# Patient Record
Sex: Female | Born: 2000 | Hispanic: Yes | Marital: Single | Smoking: Never smoker
Health system: Southern US, Community
[De-identification: ages and names within clinical notes are randomized; demographics above are authoritative.]

## PROBLEM LIST (undated history)

## (undated) DIAGNOSIS — N390 Urinary tract infection, site not specified: Secondary | ICD-10-CM

## (undated) DIAGNOSIS — F419 Anxiety disorder, unspecified: Secondary | ICD-10-CM

## (undated) DIAGNOSIS — K219 Gastro-esophageal reflux disease without esophagitis: Secondary | ICD-10-CM

## (undated) HISTORY — DX: Anxiety disorder, unspecified: F41.9

## (undated) HISTORY — DX: Urinary tract infection, site not specified: N39.0

## (undated) HISTORY — PX: APPENDECTOMY: SHX54

## (undated) HISTORY — DX: Gastro-esophageal reflux disease without esophagitis: K21.9

---
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
## 2019-01-25 ENCOUNTER — Other Ambulatory Visit: Payer: Self-pay | Admitting: Family Medicine

## 2019-01-25 DIAGNOSIS — N920 Excessive and frequent menstruation with regular cycle: Secondary | ICD-10-CM

## 2019-01-25 MED ORDER — LEVONORGESTREL-ETHINYL ESTRAD 0.1-20 MG-MCG PO TABS: 1.0000 | ORAL_TABLET | Freq: Every day | ORAL | 3 refills | Status: DC

## 2019-01-25 NOTE — Telephone Encounter (Signed)
Copied from [place] [id number]. Topic: Quick Communication - Rx Refill/Question >> Jan 25, 2019  9:40 AM Yvette Rack wrote: Medication: levonorgestrel-ethinyl estradiol (VIENVA) 0.1-20 MG-MCG tablet  Has the patient contacted their pharmacy? no  Preferred Pharmacy (with phone number or street name): [id number] - [place], [place] - [address] AT [place] Phone:  [phone]  Fax:  [phone]  Agent: Please be advised that RX refills may take up to 3 business days. We ask that you follow-up with your pharmacy.

## 2019-02-21 DIAGNOSIS — L906 Striae atrophicae: Secondary | ICD-10-CM | POA: Diagnosis not present

## 2019-02-21 DIAGNOSIS — L700 Acne vulgaris: Secondary | ICD-10-CM | POA: Diagnosis not present

## 2019-02-26 DIAGNOSIS — Z20822 Contact with and (suspected) exposure to covid-19: Secondary | ICD-10-CM | POA: Diagnosis not present

## 2019-03-09 DIAGNOSIS — Z20822 Contact with and (suspected) exposure to covid-19: Secondary | ICD-10-CM | POA: Diagnosis not present

## 2019-03-09 DIAGNOSIS — J019 Acute sinusitis, unspecified: Secondary | ICD-10-CM | POA: Diagnosis not present

## 2019-03-09 DIAGNOSIS — J209 Acute bronchitis, unspecified: Secondary | ICD-10-CM | POA: Diagnosis not present

## 2019-06-09 ENCOUNTER — Ambulatory Visit: Payer: Self-pay | Attending: Internal Medicine

## 2019-06-09 DIAGNOSIS — Z23 Encounter for immunization: Secondary | ICD-10-CM

## 2019-06-09 NOTE — Progress Notes (Signed)
   Covid-19 Vaccination Clinic  Name:  Sandra Richmond    MRN: [id number] DOB: [birth year]  06/09/2019  [name] was observed post Covid-19 immunization for 15 minutes without incident. She was provided with Vaccine Information Sheet and instruction to access the V-Safe system.   [name] was instructed to call 911 with any severe reactions post vaccine: Marland Kitchen Difficulty breathing  . Swelling of face and throat  . A fast heartbeat  . A bad rash all over body  . Dizziness and weakness   Immunizations Administered    Name Date Dose VIS Date Route   Pfizer COVID-19 Vaccine 06/09/2019 11:31 AM 0.3 mL 04/04/2018 Intramuscular   Manufacturer: [place]   Lot: [id number]   [place]: [id number]

## 2019-07-02 ENCOUNTER — Ambulatory Visit: Payer: Self-pay

## 2019-07-07 ENCOUNTER — Ambulatory Visit: Payer: Self-pay | Attending: Internal Medicine

## 2019-07-07 DIAGNOSIS — Z23 Encounter for immunization: Secondary | ICD-10-CM

## 2019-07-07 NOTE — Progress Notes (Signed)
   Covid-19 Vaccination Clinic  Name:  Sandra Richmond    MRN: [id number] DOB: 2000-10-03  07/07/2019  [name] was observed post Covid-19 immunization for 15 minutes without incident. She was provided with Vaccine Information Sheet and instruction to access the V-Safe system.   [name] was instructed to call 911 with any severe reactions post vaccine: Marland Kitchen Difficulty breathing  . Swelling of face and throat  . A fast heartbeat  . A bad rash all over body  . Dizziness and weakness   Immunizations Administered    Name Date Dose VIS Date Route   Pfizer COVID-19 Vaccine 07/07/2019 10:34 AM 0.3 mL 04/04/2018 Intramuscular   Manufacturer: [employer], [employer]   Lot: [id number]   [place]: [id number]

## 2019-07-10 ENCOUNTER — Telehealth: Payer: Self-pay | Admitting: Family Medicine

## 2019-07-10 DIAGNOSIS — N920 Excessive and frequent menstruation with regular cycle: Secondary | ICD-10-CM

## 2019-07-10 NOTE — Telephone Encounter (Signed)
Caller [place] Call back phone [phone]  Patient states insurance company no longer is going to cover omeprazole. Patient was told by insurance she needs to take an over the counter medicine.    Medication: levonorgestrel-ethinyl estradiol (VIENVA) 0.1-20 MG-MCG tablet       Has the patient contacted their pharmacy?  (If no, request that the patient contact the pharmacy for the refill.) (If yes, when and what did the pharmacy advise?)     Preferred Pharmacy (with phone number or street name): [id number] - [place], [place] - [address]  [address] [place], [place] [address]  Phone:  [phone] Fax:  [phone]     Agent: Please be advised that RX refills may take up to 3 business days. We ask that you follow-up with your pharmacy.

## 2019-07-11 MED ORDER — LEVONORGESTREL-ETHINYL ESTRAD 0.1-20 MG-MCG PO TABS: 1.0000 | ORAL_TABLET | Freq: Every day | ORAL | 0 refills | Status: DC

## 2019-07-11 NOTE — Telephone Encounter (Signed)
Medication sent in for patient. 

## 2019-07-12 ENCOUNTER — Telehealth: Payer: Self-pay | Admitting: Family Medicine

## 2019-07-12 NOTE — Telephone Encounter (Signed)
Please advise on otc medication. I will call patient.

## 2019-07-12 NOTE — Telephone Encounter (Signed)
I would recommend priolosec OTC- this is actually also omeprazole

## 2019-07-12 NOTE — Telephone Encounter (Signed)
Patient notified and verbalized understanding. 

## 2019-07-12 NOTE — Telephone Encounter (Signed)
Caller Sandra Richmond Call back phone number:[phone]  Patient states insurance company no longer is going to cover omeprazole. Patient was told by insurance she needs to take an over the counter medicine.  Patient is looking for a recommendation for over the counter medication

## 2019-07-23 ENCOUNTER — Telehealth (INDEPENDENT_AMBULATORY_CARE_PROVIDER_SITE_OTHER): Payer: BC Managed Care – PPO | Admitting: Family Medicine

## 2019-07-23 ENCOUNTER — Encounter: Payer: Self-pay | Admitting: Family Medicine

## 2019-07-23 VITALS — Temp 99.6°F | Ht 67.0 in

## 2019-07-23 DIAGNOSIS — J029 Acute pharyngitis, unspecified: Secondary | ICD-10-CM | POA: Diagnosis not present

## 2019-07-23 MED ORDER — AMOXICILLIN 875 MG PO TABS: 875.0000 mg | ORAL_TABLET | Freq: Two times a day (BID) | ORAL | 0 refills | Status: DC

## 2019-07-23 NOTE — Progress Notes (Signed)
Virtual Visit via Video Note  I connected with Sandra Richmond on 07/23/19 at  2:40 PM EDT by a video enabled telemedicine application and verified that I am speaking with the correct person using two identifiers.  Location: Patient: home alone Provider: office    I discussed the limitations of evaluation and management by telemedicine and the availability of in person appointments. The patient expressed understanding and agreed to proceed.  History of Present Illness: Pt is home and c/o sore throat since last week , + sinus congestion , runny nose, sneezing     Today she is very fatigued and very sore throat   + chills  + nausea  Pt took amoxicillin x 2 days and dayquil -- it helped some but she did not have enough and symptoms came back.     Observations/Objective: Vitals:   07/23/19 1430  Temp: 99.6 F (37.6 C)  pt is in NAD No sob   Assessment and Plan: 1. Pharyngitis, unspecified etiology Zyrtec  Throat lozenges  abx per orders  rto prn  - amoxicillin (AMOXIL) 875 MG tablet; Take 1 tablet (875 mg total) by mouth 2 (two) times daily.  Dispense: 20 tablet; Refill: 0   Follow Up Instructions:    I discussed the assessment and treatment plan with the patient. The patient was provided an opportunity to ask questions and all were answered. The patient agreed with the plan and demonstrated an understanding of the instructions.   The patient was advised to call back or seek an in-person evaluation if the symptoms worsen or if the condition fails to improve as anticipated.  I provided 30 minutes of non-face-to-face time during this encounter.   Ann Held, DO

## 2019-08-03 ENCOUNTER — Other Ambulatory Visit: Payer: Self-pay

## 2019-08-03 ENCOUNTER — Ambulatory Visit (INDEPENDENT_AMBULATORY_CARE_PROVIDER_SITE_OTHER): Payer: BC Managed Care – PPO | Admitting: Medical

## 2019-08-03 VITALS — BP 110/64 | HR 75 | Resp 18 | Ht 67.0 in | Wt 125.6 lb

## 2019-08-03 DIAGNOSIS — M2550 Pain in unspecified joint: Secondary | ICD-10-CM | POA: Diagnosis not present

## 2019-08-03 DIAGNOSIS — R5383 Other fatigue: Secondary | ICD-10-CM

## 2019-08-03 NOTE — Patient Instructions (Addendum)
For fatigue will get labs listed.  For joint pain will get inflammatory labs later. Not today as you desire to limit amount drawn and hx of vasovagal syncope.  Eat healthy diet and exercise.  Follow up in 2-3 weeks or as needed   Pt was afraid that too many labs would lead to vasovagal  syncope. She only wanted cbc. She tolerated that well. She rested and then I walked over. She stated felt fine. Walked with no dizziness. She assured me felt fine and ready to leave.   May do other labs later if she is not anemic.

## 2019-08-03 NOTE — Progress Notes (Signed)
Subjective:    Patient ID: Sandra Richmond, female    DOB: 2000/12/12, 19 y.o.   MRN: [id number]  HPI  Pt in some fatigue recently.   She states she thinks maybe anemic. State tired for one year. More so past 2 months. Sleeps 7-8 hours but will feel tired within one hour of waking. Pt states very healthy diet, work out, and states.   Pt menstrual cycles are not heavy. She is on ocp.  No depression.  No fh of low thyroid.  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      Review of Systems  Constitutional: Positive for fatigue. Negative for chills and unexpected weight change.  Respiratory: Negative for cough, chest tightness, shortness of breath and wheezing.   Cardiovascular: Negative for chest pain and palpitations.  Gastrointestinal: Negative for abdominal distention, abdominal pain, blood in stool, constipation and nausea.  Genitourinary: Negative for dysuria.  Musculoskeletal: Positive for arthralgias. Negative for back pain.       Occasional knee and elbow aches on and off. kinda painful/achy hard to describe per pt.  No tick bites.  Aches occur randomly for one year. 2-3 times a month approximate.  Skin: Negative for rash.  Neurological: Negative for dizziness, weakness, numbness and headaches.  Hematological: Negative for adenopathy. Does not bruise/bleed easily.  Psychiatric/Behavioral: Negative for behavioral problems and confusion.   Past Medical History:  Diagnosis Date  . Anxiety   . GERD (gastroesophageal reflux disease)   . Urinary tract infection      Social History   Socioeconomic History  . Marital status: Single    Spouse name: Not on file  . Number of children: Not on file  . Years of education: Not on file  . Highest education level: Not on file  Occupational History  . Not on file  Tobacco Use  . Smoking status: Never Smoker  . Smokeless tobacco: Never Used  Vaping Use  . Vaping Use: Never used  Substance and Sexual Activity  . Alcohol  use: No  . Drug use: No  . Sexual activity: Never    Birth control/protection: Abstinence  Other Topics Concern  . Not on file  Social History Narrative  . Not on file   Social Determinants of Health   Financial Resource Strain:   . Difficulty of Paying Living Expenses:   Food Insecurity:   . Worried About Charity fundraiser in the Last Year:   . Arboriculturist in the Last Year:   Transportation Needs:   . Film/video editor (Medical):   Marland Kitchen Lack of Transportation (Non-Medical):   Physical Activity:   . Days of Exercise per Week:   . Minutes of Exercise per Session:   Stress:   . Feeling of Stress :   Social Connections:   . Frequency of Communication with Friends and Family:   . Frequency of Social Gatherings with Friends and Family:   . Attends Religious Services:   . Active Member of Clubs or Organizations:   . Attends Archivist Meetings:   Marland Kitchen Marital Status:   Intimate Partner Violence:   . Fear of Current or Ex-Partner:   . Emotionally Abused:   Marland Kitchen Physically Abused:   . Sexually Abused:     Past Surgical History:  Procedure Laterality Date  . APPENDECTOMY    . LAPAROSCOPIC APPENDECTOMY N/A 07/06/2013   Procedure: APPENDECTOMY LAPAROSCOPIC;  Surgeon: Jerilynn Mages. Gerald Stabs, MD;  Location: [place];  Service:  Pediatrics;  Laterality: N/A;    Family History  Problem Relation Age of Onset  . Diabetes Paternal Grandmother   . Diabetes Paternal Grandfather   . Colon cancer Neg Hx     Allergies  Allergen Reactions  . Diflucan [Fluconazole]     Felt strange- see ER note 03/02/17    Current Outpatient Medications on File Prior to Visit  Medication Sig Dispense Refill  . amoxicillin (AMOXIL) 875 MG tablet Take 1 tablet (875 mg total) by mouth 2 (two) times daily. 20 tablet 0  . levonorgestrel-ethinyl estradiol (VIENVA) 0.1-20 MG-MCG tablet Take 1 tablet by mouth daily. NEEDS OV FOR FURTHER REFILLS 3 Package 0  . omeprazole (PRILOSEC) 20 MG capsule Take 1  capsule (20 mg total) by mouth daily. 90 capsule 3   No current facility-administered medications on file prior to visit.    BP 110/64 (BP Location: Left Arm, Patient Position: Sitting, Cuff Size: Normal)   Pulse 75   Resp 18   Ht 5\' 7"  (1.702 m)   Wt 125 lb 9.6 oz (57 kg)   LMP 07/06/2019 (Approximate)   SpO2 98%   BMI 19.67 kg/m        Objective:   Physical Exam  General Mental Status- Alert. General Appearance- Not in acute distress.   Skin General: Color- Normal Color. Moisture- Normal Moisture.  Neck Carotid Arteries- Normal color. Moisture- Normal Moisture. No carotid bruits. No JVD. No thyromegaly.  Chest and Lung Exam Auscultation: Breath Sounds:-Normal.  Cardiovascular Auscultation:Rythm- Regular. Murmurs & Other Heart Sounds:Auscultation of the heart reveals- No Murmurs.  Abdomen Inspection:-Inspeection Normal. Palpation/Percussion:Note:No mass. Palpation and Percussion of the abdomen reveal- Non Tender, Non Distended + BS, no rebound or guarding.    Neurologic Cranial Nerve exam:- CN III-XII intact(No nystagmus), symmetric smile. tStrength:- 5/5 equal and symmetric strength both upper and lower extremities.      Assessment & Plan:  For fatigue will get labs listed.  For joint pain will get inflammatory labs later. Not today as you desire to limit amount drawn and hx of vasovagal syncope.  Eat healthy diet and exercise.  Follow up in 2-3 weeks or as needed  Pt was afraid that too many labs would lead to syncope. She only wanted cbc. She tolerated that well. She rested and then I walked over. She stated felt fine. Walked with no dizziness. She assured me felt fine and ready to leave.   May do other labs later if she is not anemic.  Time spent with patient today was 30  minutes which consisted of chart review, discussing diagnosis, work up treatment and documentation.  Mackie Pai, PA-C

## 2019-08-07 LAB — EPSTEIN-BARR VIRUS VCA ANTIBODY PANEL

## 2019-08-07 LAB — CBC WITH DIFFERENTIAL/PLATELET
Absolute Monocytes: 660 cells/uL (ref 200–950)
Basophils Absolute: 44 cells/uL (ref 0–200)
Basophils Relative: 0.5 %
Eosinophils Absolute: 70 cells/uL (ref 15–500)
Eosinophils Relative: 0.8 %
HCT: 39 % (ref 35.0–45.0)
Hemoglobin: 13.2 g/dL (ref 11.7–15.5)
Lymphs Abs: 3230 cells/uL (ref 850–3900)
MCH: 30 pg (ref 27.0–33.0)
MCHC: 33.8 g/dL (ref 32.0–36.0)
MCV: 88.6 fL (ref 80.0–100.0)
MPV: 10.5 fL (ref 7.5–12.5)
Monocytes Relative: 7.5 %
Neutro Abs: 4796 cells/uL (ref 1500–7800)
Neutrophils Relative %: 54.5 %
Platelets: 312 10*3/uL (ref 140–400)
RBC: 4.4 10*6/uL (ref 3.80–5.10)
RDW: 11.8 % (ref 11.0–15.0)
Total Lymphocyte: 36.7 %
WBC: 8.8 10*3/uL (ref 3.8–10.8)

## 2019-08-07 LAB — TSH

## 2019-08-07 LAB — COMPREHENSIVE METABOLIC PANEL

## 2019-08-07 LAB — VITAMIN B1

## 2019-08-07 LAB — VITAMIN B12

## 2019-08-09 ENCOUNTER — Telehealth (INDEPENDENT_AMBULATORY_CARE_PROVIDER_SITE_OTHER): Payer: BC Managed Care – PPO | Admitting: Family Medicine

## 2019-08-09 ENCOUNTER — Other Ambulatory Visit: Payer: Self-pay

## 2019-08-09 DIAGNOSIS — J029 Acute pharyngitis, unspecified: Secondary | ICD-10-CM | POA: Diagnosis not present

## 2019-08-09 NOTE — Progress Notes (Signed)
[facility] at [facility] [address], [place], [place] [address] [phone]  Date:  08/09/2019   Name:  Sandra Richmond   DOB:  [birth year]   MRN:  [id number]  PCP:  Darreld Mclean, MD    Chief Complaint: No chief complaint on file.   History of Present Illness:  Sandra Richmond is a 19 y.o. very pleasant female patient who presents with the following:  Generally healthy young woman with history of appendectomy in 2015.  Here today with concern of sore throat-virtual visit  Patient location is home, provider location is office.  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  She took some leftover amox that she had at home which seemed to help but she only had a couple of days of abx on hand.    It sounds like she took a few pills sporadically over the course of few days, her symptoms will start to get better today,  She then got sick with red eyes, malaise, bad ST She did a virtual visit on 6/14 for sore throat She was treated with amox per Dr Etter Sjogren -given a full 10-day course of amoxicillin which she did complete  Since that time she notes that she mostly feels better, but when she looks in her throat her right tonsil still appears a little swollen.  She has not had any fever or malaise, her throat is not actually sore right now  She is planning to travel to [place] for a few days  Patient Active Problem List   Diagnosis Date Noted  . Appendicitis, acute 07/06/2013    Past Medical History:  Diagnosis Date  . Anxiety   . GERD (gastroesophageal reflux disease)   . Urinary tract infection     Past Surgical History:  Procedure Laterality Date  . APPENDECTOMY    .  LAPAROSCOPIC APPENDECTOMY N/A 07/06/2013   Procedure: APPENDECTOMY LAPAROSCOPIC;  Surgeon: Jerilynn Mages. Gerald Stabs, MD;  Location: [facility];  Service: Pediatrics;  Laterality: N/A;    Social History   Tobacco Use  . Smoking status: Never Smoker  . Smokeless tobacco: Never Used  Vaping Use  . Vaping Use: Never used  Substance Use Topics  . Alcohol use: No  . Drug use: No    Family History  Problem Relation Age of Onset  . Diabetes Paternal Grandmother   . Diabetes Paternal Grandfather   . Colon cancer Neg Hx     Allergies  Allergen Reactions  . Diflucan [Fluconazole]     Felt strange- see ER note 03/02/17    Medication list has been reviewed and updated.  Current Outpatient Medications on File Prior to Visit  Medication Sig Dispense Refill  . amoxicillin (AMOXIL) 875 MG tablet Take 1 tablet (875 mg total) by mouth 2 (two) times daily. 20 tablet 0  . levonorgestrel-ethinyl estradiol (VIENVA) 0.1-20 MG-MCG tablet Take 1 tablet by mouth daily. NEEDS OV FOR FURTHER REFILLS 3 Package 0  . omeprazole (PRILOSEC) 20 MG capsule Take 1 capsule (20 mg total) by mouth daily. 90 capsule 3   No current facility-administered medications on file prior to visit.    Review of Systems:  As per HPI- otherwise negative.   Physical Examination: There were no vitals filed for this visit. There were no vitals filed for this visit. There is no height or weight on file to calculate BMI. Ideal Body Weight:    Patient observed over video monitor.  She looks well, no cough, wheezing, distress is observed.  We tried to look at her throat but it did not work over video. She is not currently checking vital signs Her cervical lymph nodes are not tender to palpation Assessment and Plan: Pharyngitis, unspecified etiology  Video visit today to discuss recurrent pharyngitis.  The patient self treated with some leftover amoxicillin for a few days, symptoms then returned and she was treated with a full 10-day  course of amoxicillin.  She is currently feeling much better, but is not sure if she is well.  I advised her that most likely she will continue to improve, I would not advise more antibiotics at this time.  If she does get sick again I would like to get a throat swab to help determine if she actually has strep.  Mono infection would be less likely because she did not break out in a rash with use of amoxicillin.  If she gets sick while out of town, I encouraged her to be seen at an urgent care or minute clinic so that she can get at least a rapid strep.  If this is not possible she can message me and I will prescribe more antibiotics  Patient expressed understanding, she will keep me posted  Signed Lamar Blinks, MD

## 2019-08-13 ENCOUNTER — Encounter: Payer: Self-pay | Admitting: Family Medicine

## 2019-08-13 DIAGNOSIS — J029 Acute pharyngitis, unspecified: Secondary | ICD-10-CM | POA: Diagnosis not present

## 2019-09-05 DIAGNOSIS — D225 Melanocytic nevi of trunk: Secondary | ICD-10-CM | POA: Diagnosis not present

## 2019-09-05 DIAGNOSIS — L700 Acne vulgaris: Secondary | ICD-10-CM | POA: Diagnosis not present

## 2019-09-14 ENCOUNTER — Telehealth: Payer: Self-pay | Admitting: Family Medicine

## 2019-09-14 DIAGNOSIS — N920 Excessive and frequent menstruation with regular cycle: Secondary | ICD-10-CM

## 2019-09-14 MED ORDER — LEVONORGESTREL-ETHINYL ESTRAD 0.1-20 MG-MCG PO TABS: 1.0000 | ORAL_TABLET | Freq: Every day | ORAL | 3 refills | Status: DC

## 2019-09-14 NOTE — Telephone Encounter (Signed)
Med has been refilled.

## 2019-09-14 NOTE — Telephone Encounter (Signed)
Medication: levonorgestrel-ethinyl estradiol (VIENVA) 0.1-20 MG-MCG tablet [[id number]]       Has the patient contacted their pharmacy?  (If no, request that the patient contact the pharmacy for the refill.) (If yes, when and what did the pharmacy advise?)     Preferred Pharmacy (with phone number or street name): [id number] - [place], [place] - [address]  [address] [place], [place] [address]  Phone:  [phone] Fax:  [phone]      Agent: Please be advised that RX refills may take up to 3 business days. We ask that you follow-up with your pharmacy.

## 2019-09-27 ENCOUNTER — Telehealth (INDEPENDENT_AMBULATORY_CARE_PROVIDER_SITE_OTHER): Payer: BC Managed Care – PPO | Admitting: Nurse Practitioner

## 2019-09-27 ENCOUNTER — Telehealth: Payer: Self-pay | Admitting: Nurse Practitioner

## 2019-09-27 ENCOUNTER — Encounter: Payer: Self-pay | Admitting: Nurse Practitioner

## 2019-09-27 ENCOUNTER — Other Ambulatory Visit: Payer: Self-pay

## 2019-09-27 VITALS — Ht 67.0 in | Wt 123.0 lb

## 2019-09-27 DIAGNOSIS — J029 Acute pharyngitis, unspecified: Secondary | ICD-10-CM

## 2019-09-27 MED ORDER — MAGIC MOUTHWASH W/LIDOCAINE: 5.0000 mL | Freq: Three times a day (TID) | ORAL | 0 refills | Status: DC | PRN

## 2019-09-27 NOTE — Telephone Encounter (Signed)
Patient is at the pharmacy and they state they have not received the RX for Magic mouthwash. I told her it was sent to them at 1145 this morning. Please resend asap.

## 2019-09-27 NOTE — Telephone Encounter (Signed)
Spoke with pharmacy and gave verbal for medication. Patient notified.

## 2019-09-27 NOTE — Progress Notes (Signed)
Virtual Visit via Video Note  I connected with@ on 09/27/19 at 11:00 AM EDT by a video enabled telemedicine application and verified that I am speaking with the correct person using two identifiers.  Location: Patient:Home Provider: Office Participants: patient and provider  I discussed the limitations of evaluation and management by telemedicine and the availability of in person appointments. I also discussed with the patient that there may be a patient responsible charge related to this service. The patient expressed understanding and agreed to proceed.  SA:YTKZ throat  History of Present Illness: Sore Throat  This is a recurrent problem. The current episode started 1 to 4 weeks ago. The problem has been unchanged. There has been no fever. Pertinent negatives include no congestion, coughing, ear discharge, ear pain, headaches, hoarse voice, plugged ear sensation, neck pain, shortness of breath, stridor, swollen glands, trouble swallowing or vomiting. She has had no exposure to strep or mono. She has tried cool liquids for the symptoms. The treatment provided no relief.    Observations/Objective: Physical Exam Constitutional:      General: She is not in acute distress.    Appearance: She is not ill-appearing.  HENT:     Mouth/Throat:     Tonsils: No tonsillar exudate.  Musculoskeletal:     Cervical back: Normal range of motion and neck supple.  Lymphadenopathy:     Cervical: No cervical adenopathy.  Neurological:     Mental Status: She is alert and oriented to person, place, and time.    Assessment and Plan: Sandra Richmond was seen today for sore throat.  Diagnoses and all orders for this visit:  Sore throat -     magic mouthwash w/lidocaine SOLN; Take 5 mLs by mouth 3 (three) times daily as needed for mouth pain (gargle and spit). Combine equal parts of diphenhydramine HCL, mylanta/maalox, and lidocaine to make up 140ml   Follow Up Instructions: Start magic mouth wash Increase  omeprazole to 20mg  BID x7days, then resume 20mg  daily.   I discussed the assessment and treatment plan with the patient. The patient was provided an opportunity to ask questions and all were answered. The patient agreed with the plan and demonstrated an understanding of the instructions.   The patient was advised to call back or seek an in-person evaluation if the symptoms worsen or if the condition fails to improve as anticipated.  [name], NP

## 2019-09-27 NOTE — Patient Instructions (Signed)
Start magic mouth wash Increase omeprazole to 20mg  BID x7days, then resume 20mg  daily.

## 2019-10-01 DIAGNOSIS — Z20822 Contact with and (suspected) exposure to covid-19: Secondary | ICD-10-CM | POA: Diagnosis not present

## 2019-11-30 ENCOUNTER — Telehealth: Payer: Self-pay | Admitting: Family Medicine

## 2019-11-30 DIAGNOSIS — N920 Excessive and frequent menstruation with regular cycle: Secondary | ICD-10-CM

## 2019-11-30 MED ORDER — LEVONORGESTREL-ETHINYL ESTRAD 0.1-20 MG-MCG PO TABS: 1.0000 | ORAL_TABLET | Freq: Every day | ORAL | 0 refills | Status: DC

## 2019-11-30 NOTE — Telephone Encounter (Signed)
Patient states that her parents will visit her next week and she wants her parents to bring the prescription to her in [place] when they visit . Patient states she one has one box left and normally gets three box at her refills. Patient feels comfortable with parents picking up her medicine at local pharmacy.    Medication: levonorgestrel-ethinyl estradiol (VIENVA) 0.1-20 MG-MCG tablet   Has the patient contacted their pharmacy? No. (If no, request that the patient contact the pharmacy for the refill.) (If yes, when and what did the pharmacy advise?)  Preferred Pharmacy (with phone number or street name): [id number] - [place], [place] - [address]  [address] [place], [place] [address]  Phone:  [phone] Fax:  [phone]  DEA #:  [id number]  Agent: Please be advised that RX refills may take up to 3 business days. We ask that you follow-up with your pharmacy.

## 2019-11-30 NOTE — Telephone Encounter (Signed)
Medication refilled. Patient will need to come in for further refills.

## 2019-12-11 NOTE — Progress Notes (Signed)
[place] at [facility] [address] [place], [place] [address] [phone]  Date:  12/12/2019   Name:  Sandra Richmond   DOB:  06-19-2000   MRN:  [id number]  PCP:  Darreld Mclean, MD    Chief Complaint: No chief complaint on file.   History of Present Illness:  Sandra Richmond is a 19 y.o. very pleasant female patient who presents with the following:  Generally healthy young woman, virtual visit today to follow-up on medication  Patient location is home, provider location is office.  Patient identify confirmed with 2 factors, she gives consent for virtual visit today.  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  She took her PPI BID for 6 weeks and this did not seem to really hel her   Flu vaccine- not done yet COVID-19 series complete Patient Active Problem List   Diagnosis Date Noted  . Appendicitis, acute 07/06/2013    Past Medical History:  Diagnosis Date  . Anxiety   . GERD (gastroesophageal reflux disease)   . Urinary tract infection     Past Surgical History:  Procedure Laterality Date  . APPENDECTOMY    . LAPAROSCOPIC APPENDECTOMY N/A 07/06/2013   Procedure: APPENDECTOMY LAPAROSCOPIC;  Surgeon: Jerilynn Mages. Gerald Stabs, MD;  Location: [facility];  Service:  Pediatrics;  Laterality: N/A;    Social History   Tobacco Use  . Smoking status: Never Smoker  . Smokeless tobacco: Never Used  Vaping Use  . Vaping Use: Never used  Substance Use Topics  . Alcohol use: No  . Drug use: No    Family History  Problem Relation Age of Onset  . Diabetes Paternal Grandmother   . Diabetes Paternal Grandfather   . Colon cancer Neg Hx     Allergies  Allergen Reactions  . Diflucan [Fluconazole]     Felt strange- see ER note 03/02/17    Medication list has been reviewed and updated.  Current Outpatient Medications on File Prior to Visit  Medication Sig Dispense Refill  . levonorgestrel-ethinyl estradiol (VIENVA) 0.1-20 MG-MCG tablet Take 1 tablet by mouth daily. NEEDS OV FOR FURTHER REFILLS 84 tablet 0  . magic mouthwash w/lidocaine SOLN Take 5 mLs by mouth 3 (three) times daily as needed for mouth pain (gargle and spit). Combine equal parts of diphenhydramine HCL, mylanta/maalox, and lidocaine to make up 142ml 180 mL 0  . omeprazole (PRILOSEC) 20 MG capsule Take 1 capsule (20 mg total) by mouth daily. 90 capsule 3   No current facility-administered medications on file prior to visit.    Review of Systems:  As per HPI- otherwise negative.   Physical Examination: There  were no vitals filed for this visit. There were no vitals filed for this visit. There is no height or weight on file to calculate BMI. Ideal Body Weight:    Patient observed her video monitor, she looks well and her normal self   Assessment and Plan: Gastroesophageal reflux disease, unspecified whether esophagitis present - Plan: sucralfate (CARAFATE) 1 g tablet  Menorrhagia with regular cycle  Encounter for surveillance of contraceptive pills    Patient seen today for virtual visit-video used for duration of visit Issues with reflux for over a year, she was evaluated by GI last year and had endoscopy We will add Carafate for 10 days, she plans to increase omeprazole  to twice a day We schedule an in person visit for Thanksgiving break, in about 3 weeks-if getting worse in the meantime she will let me know Continue current oral contraceptive pill, can refill when needed  This visit occurred during the SARS-CoV-2 public health emergency.  Safety protocols were in place, including screening questions prior to the visit, additional usage of staff PPE, and extensive cleaning of exam room while observing appropriate contact time as indicated for disinfecting solutions.    Signed Lamar Blinks, MD

## 2019-12-12 ENCOUNTER — Other Ambulatory Visit: Payer: Self-pay

## 2019-12-12 ENCOUNTER — Telehealth (INDEPENDENT_AMBULATORY_CARE_PROVIDER_SITE_OTHER): Payer: BC Managed Care – PPO | Admitting: Family Medicine

## 2019-12-12 ENCOUNTER — Encounter: Payer: Self-pay | Admitting: Family Medicine

## 2019-12-12 DIAGNOSIS — N920 Excessive and frequent menstruation with regular cycle: Secondary | ICD-10-CM | POA: Diagnosis not present

## 2019-12-12 DIAGNOSIS — K219 Gastro-esophageal reflux disease without esophagitis: Secondary | ICD-10-CM | POA: Diagnosis not present

## 2019-12-12 DIAGNOSIS — Z3041 Encounter for surveillance of contraceptive pills: Secondary | ICD-10-CM | POA: Diagnosis not present

## 2019-12-12 MED ORDER — SUCRALFATE 1 G PO TABS: 1.0000 g | ORAL_TABLET | Freq: Three times a day (TID) | ORAL | 0 refills | Status: DC

## 2020-01-02 ENCOUNTER — Ambulatory Visit: Payer: BC Managed Care – PPO | Admitting: Family Medicine

## 2020-01-21 DIAGNOSIS — D485 Neoplasm of uncertain behavior of skin: Secondary | ICD-10-CM | POA: Diagnosis not present

## 2020-01-21 DIAGNOSIS — D225 Melanocytic nevi of trunk: Secondary | ICD-10-CM | POA: Diagnosis not present

## 2020-01-21 DIAGNOSIS — B351 Tinea unguium: Secondary | ICD-10-CM | POA: Diagnosis not present

## 2020-01-21 DIAGNOSIS — L603 Nail dystrophy: Secondary | ICD-10-CM | POA: Diagnosis not present

## 2020-02-20 ENCOUNTER — Telehealth: Payer: Self-pay | Admitting: Family Medicine

## 2020-02-20 DIAGNOSIS — N920 Excessive and frequent menstruation with regular cycle: Secondary | ICD-10-CM

## 2020-02-20 MED ORDER — LEVONORGESTREL-ETHINYL ESTRAD 0.1-20 MG-MCG PO TABS: 1.0000 | ORAL_TABLET | Freq: Every day | ORAL | 3 refills | Status: DC

## 2020-02-20 NOTE — Telephone Encounter (Signed)
Medication: levonorgestrel-ethinyl estradiol (VIENVA) 0.1-20 MG-MCG tablet    Has the patient contacted their pharmacy? No. (If no, request that the patient contact the pharmacy for the refill.) (If yes, when and what did the pharmacy advise?)  Preferred Pharmacy (with phone number or street name): [email or web address] [address] [place], [place] [address] [phone]  Agent: Please be advised that RX refills may take up to 3 business days. We ask that you follow-up with your pharmacy.

## 2020-02-20 NOTE — Telephone Encounter (Signed)
Medication refilled

## 2020-02-28 ENCOUNTER — Telehealth: Payer: Self-pay | Admitting: Family Medicine

## 2020-02-28 NOTE — Telephone Encounter (Signed)
Patient state she needs TDAP shot records emailed to her @ [email or web address] .   Patient states she signed and returned  a release of information a few weeks ago.

## 2020-02-28 NOTE — Telephone Encounter (Signed)
Records have been sent to patient ask requested.

## 2020-03-05 DIAGNOSIS — B349 Viral infection, unspecified: Secondary | ICD-10-CM | POA: Diagnosis not present

## 2020-03-05 DIAGNOSIS — Z20822 Contact with and (suspected) exposure to covid-19: Secondary | ICD-10-CM | POA: Diagnosis not present

## 2020-03-05 DIAGNOSIS — U071 COVID-19: Secondary | ICD-10-CM | POA: Diagnosis not present

## 2020-03-05 DIAGNOSIS — R059 Cough, unspecified: Secondary | ICD-10-CM | POA: Diagnosis not present

## 2020-03-20 DIAGNOSIS — F4323 Adjustment disorder with mixed anxiety and depressed mood: Secondary | ICD-10-CM | POA: Diagnosis not present

## 2020-03-24 DIAGNOSIS — F4323 Adjustment disorder with mixed anxiety and depressed mood: Secondary | ICD-10-CM | POA: Diagnosis not present

## 2020-05-12 DIAGNOSIS — Z20822 Contact with and (suspected) exposure to covid-19: Secondary | ICD-10-CM | POA: Diagnosis not present

## 2020-05-12 DIAGNOSIS — J00 Acute nasopharyngitis [common cold]: Secondary | ICD-10-CM | POA: Diagnosis not present

## 2020-05-12 DIAGNOSIS — R051 Acute cough: Secondary | ICD-10-CM | POA: Diagnosis not present

## 2020-05-12 DIAGNOSIS — R0982 Postnasal drip: Secondary | ICD-10-CM | POA: Diagnosis not present

## 2020-05-21 DIAGNOSIS — R300 Dysuria: Secondary | ICD-10-CM | POA: Diagnosis not present

## 2020-05-21 DIAGNOSIS — Z3202 Encounter for pregnancy test, result negative: Secondary | ICD-10-CM | POA: Diagnosis not present

## 2020-05-22 DIAGNOSIS — R102 Pelvic and perineal pain: Secondary | ICD-10-CM | POA: Diagnosis not present

## 2020-05-22 DIAGNOSIS — R300 Dysuria: Secondary | ICD-10-CM | POA: Diagnosis not present

## 2020-05-22 DIAGNOSIS — R350 Frequency of micturition: Secondary | ICD-10-CM | POA: Diagnosis not present

## 2020-06-23 DIAGNOSIS — Z124 Encounter for screening for malignant neoplasm of cervix: Secondary | ICD-10-CM | POA: Diagnosis not present

## 2020-06-23 DIAGNOSIS — Z113 Encounter for screening for infections with a predominantly sexual mode of transmission: Secondary | ICD-10-CM | POA: Diagnosis not present

## 2020-06-23 DIAGNOSIS — N3000 Acute cystitis without hematuria: Secondary | ICD-10-CM | POA: Diagnosis not present

## 2020-06-23 DIAGNOSIS — Z30011 Encounter for initial prescription of contraceptive pills: Secondary | ICD-10-CM | POA: Diagnosis not present

## 2020-06-23 DIAGNOSIS — Z01419 Encounter for gynecological examination (general) (routine) without abnormal findings: Secondary | ICD-10-CM | POA: Diagnosis not present

## 2020-06-23 DIAGNOSIS — R350 Frequency of micturition: Secondary | ICD-10-CM | POA: Diagnosis not present

## 2020-07-08 DIAGNOSIS — N946 Dysmenorrhea, unspecified: Secondary | ICD-10-CM | POA: Diagnosis not present

## 2020-07-08 DIAGNOSIS — R112 Nausea with vomiting, unspecified: Secondary | ICD-10-CM | POA: Diagnosis not present

## 2020-07-08 DIAGNOSIS — R350 Frequency of micturition: Secondary | ICD-10-CM | POA: Diagnosis not present

## 2020-07-08 DIAGNOSIS — R1084 Generalized abdominal pain: Secondary | ICD-10-CM | POA: Diagnosis not present

## 2020-07-10 DIAGNOSIS — R350 Frequency of micturition: Secondary | ICD-10-CM | POA: Diagnosis not present

## 2020-07-10 DIAGNOSIS — N3001 Acute cystitis with hematuria: Secondary | ICD-10-CM | POA: Diagnosis not present

## 2020-09-10 DIAGNOSIS — D225 Melanocytic nevi of trunk: Secondary | ICD-10-CM | POA: Diagnosis not present

## 2020-09-10 DIAGNOSIS — L821 Other seborrheic keratosis: Secondary | ICD-10-CM | POA: Diagnosis not present

## 2020-09-16 DIAGNOSIS — N3941 Urge incontinence: Secondary | ICD-10-CM | POA: Diagnosis not present

## 2020-09-16 DIAGNOSIS — N3281 Overactive bladder: Secondary | ICD-10-CM | POA: Diagnosis not present

## 2020-09-16 DIAGNOSIS — R350 Frequency of micturition: Secondary | ICD-10-CM | POA: Diagnosis not present

## 2020-09-29 DIAGNOSIS — M791 Myalgia, unspecified site: Secondary | ICD-10-CM | POA: Diagnosis not present

## 2020-10-06 DIAGNOSIS — B881 Tungiasis [sandflea infestation]: Secondary | ICD-10-CM | POA: Diagnosis not present

## 2020-10-09 DIAGNOSIS — N3941 Urge incontinence: Secondary | ICD-10-CM | POA: Diagnosis not present

## 2020-10-09 DIAGNOSIS — R350 Frequency of micturition: Secondary | ICD-10-CM | POA: Diagnosis not present

## 2020-10-09 DIAGNOSIS — N3281 Overactive bladder: Secondary | ICD-10-CM | POA: Diagnosis not present

## 2020-10-31 DIAGNOSIS — Z1152 Encounter for screening for COVID-19: Secondary | ICD-10-CM | POA: Diagnosis not present

## 2020-10-31 DIAGNOSIS — B349 Viral infection, unspecified: Secondary | ICD-10-CM | POA: Diagnosis not present

## 2020-11-07 ENCOUNTER — Other Ambulatory Visit: Payer: Self-pay

## 2020-11-07 ENCOUNTER — Telehealth: Payer: BC Managed Care – PPO | Admitting: Family Medicine

## 2020-11-07 NOTE — Progress Notes (Incomplete)
MyChart Video Visit    Virtual Visit via Video Note   This visit type was conducted due to national recommendations for restrictions regarding the COVID-19 Pandemic (e.g. social distancing) in an effort to limit this patient's exposure and mitigate transmission in our community. This patient is at least at moderate risk for complications without adequate follow up. This format is felt to be most appropriate for this patient at this time. Physical exam was limited by quality of the video and audio technology used for the visit. *** was able to get the patient set up on a video visit.  Patient location: Home Patient and provider in visit Provider location: Office  I discussed the limitations of evaluation and management by telemedicine and the availability of in person appointments. The patient expressed understanding and agreed to proceed.  Visit Date: 11/07/2020  Today's healthcare provider: Ann Held, DO     Subjective:    Patient ID: Sandra Richmond, female    DOB: 12-18-2000, 20 y.o.   MRN: [id number]  No chief complaint on file.   HPI Patient is in today for a video visit.    Past Medical History:  Diagnosis Date   Anxiety    GERD (gastroesophageal reflux disease)    Urinary tract infection     Past Surgical History:  Procedure Laterality Date   APPENDECTOMY     LAPAROSCOPIC APPENDECTOMY N/A 07/06/2013   Procedure: APPENDECTOMY LAPAROSCOPIC;  Surgeon: Jerilynn Mages. Gerald Stabs, MD;  Location: [facility];  Service: Pediatrics;  Laterality: N/A;    Family History  Problem Relation Age of Onset   Diabetes Paternal Grandmother    Diabetes Paternal Grandfather    Colon cancer Neg Hx     Social History   Socioeconomic History   Marital status: Single    Spouse name: Not on file   Number of children: Not on file   Years of education: Not on file   Highest education level: Not on file  Occupational History   Not on file  Tobacco Use   Smoking status: Never    Smokeless tobacco: Never  Vaping Use   Vaping Use: Never used  Substance and Sexual Activity   Alcohol use: No   Drug use: No   Sexual activity: Never    Birth control/protection: Abstinence  Other Topics Concern   Not on file  Social History Narrative   Not on file   Social Determinants of Health   Financial Resource Strain: Not on file  Food Insecurity: Not on file  Transportation Needs: Not on file  Physical Activity: Not on file  Stress: Not on file  Social Connections: Not on file  Intimate Partner Violence: Not on file    Outpatient Medications Prior to Visit  Medication Sig Dispense Refill   levonorgestrel-ethinyl estradiol (VIENVA) 0.1-20 MG-MCG tablet Take 1 tablet by mouth daily. 84 tablet 3   magic mouthwash w/lidocaine SOLN Take 5 mLs by mouth 3 (three) times daily as needed for mouth pain (gargle and spit). Combine equal parts of diphenhydramine HCL, mylanta/maalox, and lidocaine to make up 122ml 180 mL 0   omeprazole (PRILOSEC) 20 MG capsule Take 1 capsule (20 mg total) by mouth daily. 90 capsule 3   sucralfate (CARAFATE) 1 g tablet Take 1 tablet (1 g total) by mouth 4 (four) times daily -  with meals and at bedtime. 40 tablet 0   No facility-administered medications prior to visit.    Allergies  Allergen Reactions  Diflucan [Fluconazole]     Felt strange- see ER note 03/02/17    ROS     Objective:    Physical Exam Constitutional:      General: She is not in acute distress.    Appearance: Normal appearance. She is not ill-appearing.  Neurological:     Mental Status: She is alert.  Psychiatric:        Behavior: Behavior normal.        Judgment: Judgment normal.    There were no vitals taken for this visit. Wt Readings from Last 3 Encounters:  09/27/19 123 lb (55.8 kg) (40 %, Z= -0.26)*  08/03/19 125 lb 9.6 oz (57 kg) (45 %, Z= -0.12)*  12/04/18 114 lb (51.7 kg) (24 %, Z= -0.70)*   * Growth percentiles are based on CDC (Girls, 2-20 Years)  data.    Diabetic Foot Exam - Simple   No data filed    Lab Results  Component Value Date   WBC 8.8 08/03/2019   HGB 13.2 08/03/2019   HCT 39.0 08/03/2019   PLT 312 08/03/2019   GLUCOSE CANCELED 08/03/2019   ALT 15 08/21/2018   AST 15 08/21/2018   NA 141 08/21/2018   K 4.4 08/21/2018   CL 105 08/21/2018   CREATININE 0.89 08/21/2018   BUN 13 08/21/2018   CO2 22 08/21/2018   TSH CANCELED 08/03/2019    Lab Results  Component Value Date   TSH CANCELED 08/03/2019   Lab Results  Component Value Date   WBC 8.8 08/03/2019   HGB 13.2 08/03/2019   HCT 39.0 08/03/2019   MCV 88.6 08/03/2019   PLT 312 08/03/2019   Lab Results  Component Value Date   NA 141 08/21/2018   K 4.4 08/21/2018   CO2 22 08/21/2018   GLUCOSE CANCELED 08/03/2019   BUN 13 08/21/2018   CREATININE 0.89 08/21/2018   BILITOT 1.0 08/21/2018   ALKPHOS 66 08/21/2018   AST 15 08/21/2018   ALT 15 08/21/2018   PROT 7.3 08/21/2018   ALBUMIN 4.7 08/21/2018   CALCIUM 9.7 08/21/2018   ANIONGAP 9 03/02/2017   No results found for: CHOL No results found for: HDL No results found for: LDLCALC No results found for: TRIG No results found for: CHOLHDL No results found for: HGBA1C     Assessment & Plan:   Problem List Items Addressed This Visit   None     No orders of the defined types were placed in this encounter.   I discussed the assessment and treatment plan with the patient. The patient was provided an opportunity to ask questions and all were answered. The patient agreed with the plan and demonstrated an understanding of the instructions.   The patient was advised to call back or seek an in-person evaluation if the symptoms worsen or if the condition fails to improve as anticipated.  I,Shehryar Baig,acting as a Education administrator for [employer], DO.,have documented all relevant documentation on the behalf of Ann Held, DO,as directed by  Ann Held, DO while in the presence of  Ann Held, DO.  I provided 20 minutes of face-to-face time during this encounter.   Ann Held, DO [facility] at [employer] [facility] [phone] (phone) [phone] (fax)  [place]

## 2020-11-11 DIAGNOSIS — N3281 Overactive bladder: Secondary | ICD-10-CM | POA: Diagnosis not present

## 2020-11-11 DIAGNOSIS — R350 Frequency of micturition: Secondary | ICD-10-CM | POA: Diagnosis not present

## 2020-11-11 DIAGNOSIS — N3941 Urge incontinence: Secondary | ICD-10-CM | POA: Diagnosis not present

## 2020-11-12 DIAGNOSIS — J40 Bronchitis, not specified as acute or chronic: Secondary | ICD-10-CM | POA: Diagnosis not present

## 2020-11-12 DIAGNOSIS — R079 Chest pain, unspecified: Secondary | ICD-10-CM | POA: Diagnosis not present

## 2020-11-12 DIAGNOSIS — R059 Cough, unspecified: Secondary | ICD-10-CM | POA: Diagnosis not present

## 2020-11-12 DIAGNOSIS — J029 Acute pharyngitis, unspecified: Secondary | ICD-10-CM | POA: Diagnosis not present

## 2020-11-12 DIAGNOSIS — Z20822 Contact with and (suspected) exposure to covid-19: Secondary | ICD-10-CM | POA: Diagnosis not present

## 2020-12-09 DIAGNOSIS — N3941 Urge incontinence: Secondary | ICD-10-CM | POA: Diagnosis not present

## 2020-12-09 DIAGNOSIS — R350 Frequency of micturition: Secondary | ICD-10-CM | POA: Diagnosis not present

## 2020-12-09 DIAGNOSIS — N3281 Overactive bladder: Secondary | ICD-10-CM | POA: Diagnosis not present

## 2021-02-02 ENCOUNTER — Other Ambulatory Visit: Payer: Self-pay | Admitting: Family Medicine

## 2021-02-02 DIAGNOSIS — N920 Excessive and frequent menstruation with regular cycle: Secondary | ICD-10-CM

## 2021-02-03 ENCOUNTER — Other Ambulatory Visit: Payer: Self-pay | Admitting: Family Medicine

## 2021-02-03 DIAGNOSIS — N920 Excessive and frequent menstruation with regular cycle: Secondary | ICD-10-CM

## 2021-02-17 NOTE — Progress Notes (Signed)
[place] at [facility] [address] [place], [place] [address] [phone]  Date:  02/19/2021   Name:  Sandra Richmond   DOB:  [birth year]   MRN:  [id number]  PCP:  Darreld Mclean, MD    Chief Complaint: No chief complaint on file.   History of Present Illness:  Sandra Richmond is a 21 y.o. very pleasant female patient who presents with the following:  Patient seen today for virtual visit to discuss medication refill Patient location is home, my location is office.  Patient identity confirmed with 2 factors, she gives consent for virtual visit today. The patient and myself are present on the visit today  She is in [employer] at school- just started her semester for the spring- she is going to graduate this year!   She is studying psychology and plans to attend grad school for OT-she may actually moved to [place] for grad school.  This is her family's home country.  Most recent visit with myself was also a virtual visit in November 2021 She has been seen by urgent care a few times in the meantime for bronchitis, GERD, sore throat-I have reviewed these notes, her blood pressure is generally okay.  It was elevated once when she was sick No issues with her OCP and she would like a refill -  No abnormal bleeding patterns, she is very happy with her current pill She does monthly menses  -No suspicion of pregnancy   Past Medical History:  Diagnosis Date   Anxiety    GERD (gastroesophageal reflux disease)    Urinary tract infection     Past Surgical History:  Procedure Laterality Date   APPENDECTOMY     LAPAROSCOPIC APPENDECTOMY N/A 07/06/2013   Procedure: APPENDECTOMY LAPAROSCOPIC;  Surgeon: Jerilynn Mages. Gerald Stabs, MD;  Location: [facility];  Service: Pediatrics;  Laterality: N/A;    Social History   Tobacco Use   Smoking status: Never   Smokeless tobacco: Never  Vaping Use   Vaping Use: Never used  Substance Use Topics    Alcohol use: No   Drug use: No    Family History  Problem Relation Age of Onset   Diabetes Paternal Grandmother    Diabetes Paternal Grandfather    Colon cancer Neg Hx     Allergies  Allergen Reactions   Diflucan [Fluconazole]     Felt strange- see ER note 03/02/17    Medication list has been reviewed and updated.  Current Outpatient Medications on File Prior to Visit  Medication Sig Dispense Refill   levonorgestrel-ethinyl estradiol (VIENVA) 0.1-20 MG-MCG tablet TAKE 1 TABLET BY MOUTH DAILY 28 tablet 0   magic mouthwash w/lidocaine SOLN Take 5 mLs by mouth 3 (three) times daily as needed for mouth pain (gargle and spit). Combine equal parts of diphenhydramine HCL, mylanta/maalox, and lidocaine to make up 143ml 180 mL 0   omeprazole (PRILOSEC) 20 MG capsule Take 1 capsule (20 mg total) by mouth daily. 90 capsule 3   sucralfate (CARAFATE) 1 g tablet Take 1 tablet (1 g total) by mouth 4 (four) times daily -  with meals and at bedtime. 40 tablet 0   No current facility-administered medications on file prior to visit.    Review of Systems:  As per HPI- otherwise negative.   Physical Examination: There were no vitals filed for this visit. There were no vitals filed for this visit. There is no height or weight on  file to calculate BMI. Ideal Body Weight:    Patient observed her video monitor.  She looks well, no shortness of breath or distress  Assessment and Plan: Menorrhagia with regular cycle - Plan: levonorgestrel-ethinyl estradiol (VIENVA) 0.1-20 MG-MCG tablet  Oral contraceptive pill refill.  Patient is doing well on her current pill, provided refills for a year.  She will let me know if she ends up moving to [place]-if this occurs we will try to get her a 54-month prescription to get her started  Signed Lamar Blinks, MD

## 2021-02-19 ENCOUNTER — Telehealth (INDEPENDENT_AMBULATORY_CARE_PROVIDER_SITE_OTHER): Payer: BC Managed Care – PPO | Admitting: Family Medicine

## 2021-02-19 DIAGNOSIS — N920 Excessive and frequent menstruation with regular cycle: Secondary | ICD-10-CM

## 2021-02-19 MED ORDER — LEVONORGESTREL-ETHINYL ESTRAD 0.1-20 MG-MCG PO TABS: 1.0000 | ORAL_TABLET | Freq: Every day | ORAL | 3 refills | Status: DC

## 2021-02-26 DIAGNOSIS — R3915 Urgency of urination: Secondary | ICD-10-CM | POA: Diagnosis not present

## 2021-02-26 DIAGNOSIS — N3281 Overactive bladder: Secondary | ICD-10-CM | POA: Diagnosis not present

## 2021-02-26 DIAGNOSIS — N3941 Urge incontinence: Secondary | ICD-10-CM | POA: Diagnosis not present

## 2021-02-26 DIAGNOSIS — R351 Nocturia: Secondary | ICD-10-CM | POA: Diagnosis not present

## 2021-03-05 DIAGNOSIS — N3281 Overactive bladder: Secondary | ICD-10-CM | POA: Diagnosis not present

## 2021-03-05 DIAGNOSIS — R351 Nocturia: Secondary | ICD-10-CM | POA: Diagnosis not present

## 2021-03-05 DIAGNOSIS — R3915 Urgency of urination: Secondary | ICD-10-CM | POA: Diagnosis not present

## 2021-03-30 NOTE — Progress Notes (Signed)
[place] at [facility] [address], [place], [place] [address] [phone]  Date:  04/02/2021   Name:  Sandra Richmond   DOB:  2000-06-19   MRN:  [id number]  PCP:  Darreld Mclean, MD    Chief Complaint: Back Pain (Weight lifted and messed up back. Going on for the last 4 weeks. Upper right area near spine. Hurts to touch. ) and pins and needles feeling in both arms and legs (Going on the 4 weeks as well)   History of Present Illness:  Sandra Richmond is a 21 y.o. very pleasant female patient who presents with the following:  Patient seen today with concern of back pain Most recent visit with myself was a virtual visit in January; at that time she had started her spring semester at [employer] she notes she hurt herself lifting weights but it seems like a gradual overuse injury, not a sudden accident. She notes pain in her right upper back between the spine and the scapula for about 4 weeks It is very tender to touch It hurts when she moves her arms around It hurts to hiccup - she has not coughed that she can recall She notes that she "almost passed out" when her derm was touching her to take a picture of a mole on this area earlier today  She also noted under-eye twitches but has not occurred in 2 weeks  And she will have a pins and needles feeling in her arms or legs off and on- may last for just 2-3 seconds. She is not aware of any pattern- can occur with any sort of activity  She thinks this is also going on for about a month  Taking oral contraceptive pill, she expects her menses in the next day or 2.  Denies any possibility of pregnancy Patient Active Problem List   Diagnosis Date Noted   Appendicitis, acute 07/06/2013    Past Medical History:  Diagnosis Date   Anxiety    GERD (gastroesophageal reflux disease)    Urinary tract infection     Past Surgical History:  Procedure Laterality Date   APPENDECTOMY      LAPAROSCOPIC APPENDECTOMY N/A 07/06/2013   Procedure: APPENDECTOMY LAPAROSCOPIC;  Surgeon: Jerilynn Mages. Gerald Stabs, MD;  Location: [place];  Service: Pediatrics;  Laterality: N/A;    Social History   Tobacco Use   Smoking status: Never   Smokeless tobacco: Never  Vaping Use   Vaping Use: Never used  Substance Use Topics   Alcohol use: No   Drug use: No    Family History  Problem Relation Age of Onset   Diabetes Paternal Grandmother    Diabetes Paternal Grandfather    Colon cancer Neg Hx     Allergies  Allergen Reactions   Diflucan [Fluconazole]     Felt strange- see ER note 03/02/17    Medication list has been reviewed and updated.  Current Outpatient Medications on File Prior to Visit  Medication Sig Dispense Refill   levonorgestrel-ethinyl estradiol (VIENVA) 0.1-20 MG-MCG tablet Take 1 tablet by mouth daily. 84 tablet 3   omeprazole (PRILOSEC) 20 MG capsule Take 1 capsule (20 mg total) by mouth daily. 90 capsule 3   No current facility-administered medications on file prior to visit.    Review of Systems:  As per HPI- otherwise negative.   Physical Examination: Vitals:   04/02/21 1446 04/02/21 1517  BP: 106/60 105/70  Pulse: 84 70  Temp: 97.9 F (36.6 C)   SpO2: 99%    Vitals:   04/02/21 1446  Weight: 125 lb (56.7 kg)  Height: 5\' 7"  (1.702 m)   Body mass index is 19.58 kg/m. Ideal Body Weight: Weight in (lb) to have BMI = 25: 159.3  GEN: no acute distress.  Slender build, looks well HEENT: Atraumatic, Normocephalic. Bilateral TM wnl, oropharynx normal.  PEERL,EOMI.   Ears and Nose: No external deformity. CV: RRR, No M/G/R. No JVD. No thrill. No extra heart sounds. PULM: CTA B, no wheezes, crackles, rhonchi. No retractions. No resp. distress. No accessory muscle use. ABD: S, NT, ND, +BS. No rebound. No HSM. EXTR: No c/c/e PSYCH: Normally interactive. Conversant.  Normal range of motion of right shoulder Normal strength of all extremities Gently palpated  musculature between pt spine and right scapula (no redness, no heat or swelling, skin normal)- she became pale, sweaty and pre-syncopal and had to lie down in office for several minutes  She was given juice, water, laid down for about 20 minutes and felt better Pulse and blood pressure rechecked as above, reassuring Patient called her sister to pick her up -she would rather not drive  I advised patient I am not sure why she is having such severe tenderness over her back muscles.  I recommended having her seen in the emergency department for further evaluation as her response to light palpation was so unexpected.  Patient considered this and we both also spoke with her dad on the phone.  They decided against emergency room evaluation.  Patient and her father note that she does tend to become presyncopal quite easily in situations of pain or distress.  We did decide to get x-rays today  Assessment and Plan: Acute right-sided thoracic back pain - Plan: DG Scapula Right, DG Thoracic Spine 2 View, methocarbamol (ROBAXIN) 500 MG tablet  Pins and needles sensation  Pre-syncope  Patient seen today with concern of right-sided thoracic back pain.  As above, exam seems benign but patient had an extreme response to light palpation and nearly fainted.  She declines further evaluation in the ER.  She denies any chest pain or shortness of breath Her sister will drive her home.  Patient observed walking normally on the way to x-ray  We will obtain x-rays as above  Patient notes she is continue to exercise including lifting weights and running since this pain developed.  I encouraged her to take a break from vigorous exercise, walking okay until this is better  We will have her try Robaxin as needed for pain  Advised that her pins-and-needles sensation is difficult to evaluate without lab work.  Patient declines to have labs drawn today, she will let me know if this does not resolve  Signed Lamar Blinks, MD  Received patient's x-rays as below, message to patient  DG Thoracic Spine 2 View  Result Date: 04/02/2021 CLINICAL DATA:  Acute right-sided thoracic back pain and posterior shoulder pain. EXAM: THORACIC SPINE 2 VIEWS COMPARISON:  None. FINDINGS: There are 12 pairs of ribs. Vertebral alignment is normal. No fracture is identified. Intervertebral disc space heights are preserved. The visualized portions of the lungs are clear. IMPRESSION: Negative. Electronically Signed   By: Logan Bores M.D.   On: 04/02/2021 16:34   DG Scapula Right  Result Date: 04/02/2021 CLINICAL DATA:  Acute right-sided thoracic back pain and posterior shoulder/scapular pain. EXAM: RIGHT SCAPULA - 2+ VIEWS COMPARISON:  None. FINDINGS: There is  no evidence of fracture or other focal bone lesions. Soft tissues are unremarkable. IMPRESSION: Negative. Electronically Signed   By: Logan Bores M.D.   On: 04/02/2021 16:34

## 2021-04-02 ENCOUNTER — Ambulatory Visit (HOSPITAL_BASED_OUTPATIENT_CLINIC_OR_DEPARTMENT_OTHER)
Admission: RE | Admit: 2021-04-02 | Discharge: 2021-04-02 | Disposition: A | Discharge status: Home / Self Care | Payer: BC Managed Care – PPO | Source: Ambulatory Visit | Attending: Family Medicine | Admitting: Family Medicine

## 2021-04-02 ENCOUNTER — Encounter: Payer: Self-pay | Admitting: Family Medicine

## 2021-04-02 ENCOUNTER — Ambulatory Visit (INDEPENDENT_AMBULATORY_CARE_PROVIDER_SITE_OTHER): Payer: BC Managed Care – PPO | Admitting: Family Medicine

## 2021-04-02 ENCOUNTER — Other Ambulatory Visit: Payer: Self-pay

## 2021-04-02 VITALS — BP 105/70 | HR 70 | Temp 97.9°F | Ht 67.0 in | Wt 125.0 lb

## 2021-04-02 DIAGNOSIS — M546 Pain in thoracic spine: Secondary | ICD-10-CM | POA: Diagnosis not present

## 2021-04-02 DIAGNOSIS — D229 Melanocytic nevi, unspecified: Secondary | ICD-10-CM | POA: Diagnosis not present

## 2021-04-02 DIAGNOSIS — M25511 Pain in right shoulder: Secondary | ICD-10-CM | POA: Diagnosis not present

## 2021-04-02 DIAGNOSIS — R202 Paresthesia of skin: Secondary | ICD-10-CM

## 2021-04-02 DIAGNOSIS — R55 Syncope and collapse: Secondary | ICD-10-CM | POA: Diagnosis not present

## 2021-04-02 MED ORDER — METHOCARBAMOL 500 MG PO TABS: 500.0000 mg | ORAL_TABLET | Freq: Three times a day (TID) | ORAL | 0 refills | Status: DC | PRN

## 2021-04-02 NOTE — Patient Instructions (Signed)
Good to see you today- please go to the ground floor to have x-rays done today Then try taking a break from weight training and more intense exercise like running- walking is ok Try the robaxin as needed for pain- muscle relaxer If you would like me to get labs / blood to look for a reason for your eye muscle twitch and pins and needles feeling I am glad to  Otherwise please let me know if you are not feeling better soon

## 2021-04-07 ENCOUNTER — Telehealth: Payer: Self-pay | Admitting: Family Medicine

## 2021-04-07 DIAGNOSIS — M546 Pain in thoracic spine: Secondary | ICD-10-CM

## 2021-04-07 NOTE — Telephone Encounter (Signed)
Pt was recently seen here on 2/23 for back pain  Pt would like a referral sent to sports meds or referred out for further evaluation.  Please advise.

## 2021-04-08 ENCOUNTER — Encounter: Payer: Self-pay | Admitting: Family Medicine

## 2021-04-08 NOTE — Telephone Encounter (Signed)
Pt called again regarding referral update  ? ?Please advise  ?

## 2021-04-08 NOTE — Telephone Encounter (Signed)
Okay to place referral

## 2021-04-08 NOTE — Telephone Encounter (Signed)
Referral was placed today by Dr Lorelei Pont.  ? ?MyChart message was also sent by Dr Lorelei Pont: "Hi Meiya-I referred you to sports medicine.  They should call you within the next few days to set up an appointment." ?

## 2021-04-09 NOTE — Telephone Encounter (Signed)
Pt scheduled for 04/13/21 with sports  med.  ?

## 2021-04-10 NOTE — Progress Notes (Signed)
? ? Sandra Richmond ?[facility] ?[place] ?Phone: [phone] ?  ?Assessment and Plan:   ?  ?1. Acute bilateral thoracic back pain ?2. Strain of right rhomboid muscle ?-Acute, uncomplicated, initial sports medicine visit ?- Likely acute strain of right rhomboid based on HPI, physical exam, unremarkable x-rays ?- Start meloxicam 15 mg daily x2 weeks.  If still having pain after 2 weeks, complete 3rd-week of meloxicam. May use remaining meloxicam as needed once daily for pain control.  Do not to use additional NSAIDs while taking meloxicam.  May use Tylenol [phone] mg to 3 times a day for breakthrough pain.  Patient on birth control ?- Patient has not picked up or started Robaxin prescribed by PCP.  Patient could pick up this medication and use it as needed at night ?- Recommend not performing upper extremity weightlifting or other physical exercises that significantly worsen pain for the next 2 weeks ?  ?Pertinent previous records reviewed include PCP note 04/02/2021, x-ray thoracic spine 04/02/2021, x-ray scapula right 04/02/2021 ?  ?Follow Up: Would typically plan on a 2 to 3-week follow-up with patient to reevaluate and potentially perform OMT, however patient is currently on spring break and is going back to [facility] next week.  Patient may follow-up locally if no improvement ?  ?Subjective:   ?I, Sandra Richmond, am serving as a Education administrator for [employer] ? ?Chief Complaint: thoracic back pain  ? ?HPI:  ?04/13/2021 ?Patient is a 21 year old female complaining of thoracic pain. Patient states she hurt herself lifting weights but it seems like a gradual overuse injury, not a sudden accident. She notes pain/ discomfort in her right upper back between the spine and the scapula for about 4 weeks It is very tender to touch it hurts when she moves her arms around It hurts to hiccup - she has not coughed that she can recall. She notes that she  "almost passed out" when her derm was touching her to take a picture of a mole on this area, has been icing and has had an xray done is asking not to be touched , does get numbness and tingling sometimes when she is  weightlifting and sometimes just when she stands up.  Posterior right pain is not associated with eating and patient does not have right upper quadrant pain. ? ? ?Relevant Historical Information: On birth control ? ?Additional pertinent review of systems negative. ? ? ?Current Outpatient Medications:  ?  levonorgestrel-ethinyl estradiol (VIENVA) 0.1-20 MG-MCG tablet, Take 1 tablet by mouth daily., Disp: 84 tablet, Rfl: 3 ?  meloxicam (MOBIC) 15 MG tablet, Take 1 tablet (15 mg total) by mouth daily., Disp: 30 tablet, Rfl: 0 ?  methocarbamol (ROBAXIN) 500 MG tablet, Take 1 tablet (500 mg total) by mouth every 8 (eight) hours as needed for muscle spasms., Disp: 30 tablet, Rfl: 0 ?  omeprazole (PRILOSEC) 20 MG capsule, Take 1 capsule (20 mg total) by mouth daily., Disp: 90 capsule, Rfl: 3  ? ?Objective:   ?  ?Vitals:  ? 04/13/21 1105  ?BP: 120/80  ?Pulse: 97  ?SpO2: 99%  ?Weight: 123 lb (55.8 kg)  ?Height: 5\' 7"  (1.702 m)  ?  ?  ?Body mass index is 19.26 kg/m?.  ?  ?Physical Exam:   ? ?Gen: Appears well, nad, nontoxic and pleasant ?Psych: Alert and oriented, appropriate mood and affect ?Neuro: sensation intact, strength is 5/5 in upper and lower extremities, muscle tone wnl ?  Skin: no susupicious lesions or rashes ? ?Back - Normal skin, Spine with normal alignment and no deformity.   ?No tenderness to vertebral process palpation.   ?Paraspinous muscles are not tender and without spasm ?Straight leg raise negative ?Trendelenberg negative ?Severe TTP right rhomboids, NTTP left rhomboids ?No localized or radicular pain with palpation of right upper quadrant ? ? ?Electronically signed by:  ?Sandra Richmond ?[facility] ?11:50 AM 04/13/21 ?

## 2021-04-13 ENCOUNTER — Other Ambulatory Visit: Payer: Self-pay

## 2021-04-13 ENCOUNTER — Ambulatory Visit: Payer: BC Managed Care – PPO | Admitting: Sports Medicine

## 2021-04-13 ENCOUNTER — Other Ambulatory Visit: Payer: Self-pay | Admitting: Sports Medicine

## 2021-04-13 VITALS — BP 120/80 | HR 97 | Ht 67.0 in | Wt 123.0 lb

## 2021-04-13 DIAGNOSIS — S29012A Strain of muscle and tendon of back wall of thorax, initial encounter: Secondary | ICD-10-CM

## 2021-04-13 DIAGNOSIS — M546 Pain in thoracic spine: Secondary | ICD-10-CM | POA: Diagnosis not present

## 2021-04-13 MED ORDER — MELOXICAM 15 MG PO TABS: 15.0000 mg | ORAL_TABLET | Freq: Every day | ORAL | 0 refills | Status: DC

## 2021-04-13 NOTE — Patient Instructions (Addendum)
Good to see you  ?Start meloxicam 15 mg daily x2 weeks.  If still having pain after 2 weeks, complete 3rd-week of meloxicam. May use remaining meloxicam as needed once daily for pain control.  Do not to use additional NSAIDs while taking meloxicam.  May use Tylenol [phone] mg to 3 times a day for breakthrough pain. ?Recommend that you pickup the robaxin use as needed at night ?Scapular HEP ?I believe that this is a right rhomboid strain ?Recommend limiting upper extremity weight lifting  ?As needed follow up  ? ? ? ?

## 2021-05-10 ENCOUNTER — Other Ambulatory Visit: Payer: Self-pay | Admitting: Sports Medicine

## 2021-05-18 ENCOUNTER — Telehealth: Payer: Self-pay | Admitting: Sports Medicine

## 2021-05-18 NOTE — Telephone Encounter (Signed)
Pt was made an appointment for Friday the 14th  ?

## 2021-05-18 NOTE — Telephone Encounter (Signed)
Pt called for advice on next steps. She felt better but has since restarted working out and is having discomfort again. No numbness. Some eye twitching. ?Should she be taking the Mobic daily or as needed? Higher dose? Massage? ? 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 I recommended a phone visit for these questions but she declined at this time. ?

## 2021-05-21 NOTE — Progress Notes (Signed)
? ? Sandra Richmond Sandra Richmond ?[facility] ?[place] ?Phone: [phone] ?  ?Assessment and Plan:   ?  ?1. Acute bilateral thoracic back pain ?2. Strain of right rhomboid muscle ?3. Somatic dysfunction of thoracic region ?4. Somatic dysfunction of lumbar region ?5. Somatic dysfunction of rib region ?-Acute, improving, subsequent visit ?- Overall moderate improvement in middle back pain most prominent along right rhomboid after 2-week course of meloxicam and HEP ?- Complete an additional 1 week course of meloxicam and then use remaining as needed ?- Continue HEP ?- Patient elected for initial OMT today.  Tolerated well per note below. ?- Decision today to treat with OMT was based on Physical Exam ? ?After verbal consent patient was treated with HVLA (high velocity low amplitude), ME (muscle energy), FPR (flex positional release), ST (soft tissue), PC/PD (Pelvic Compression/ Pelvic Decompression) techniques in  , rib, thoracic, lumbar areas. Patient tolerated the procedure well with improvement in symptoms.  Patient educated on potential side effects of soreness and recommended to rest, hydrate, and use Tylenol as needed for pain control.  ?  ?Pertinent previous records reviewed include none ?  ?Follow Up: As needed if no improving or worsening of symptoms.  Could consider physical therapy versus trigger point injections versus repeat OMT ?  ?Subjective:   ?I, Pincus Badder, am serving as a Education administrator for [employer] ?  ?Chief Complaint: thoracic back pain  ?  ?HPI:  ?04/13/2021 ?Patient is a 21 year old female complaining of thoracic pain. Patient states she hurt herself lifting weights but it seems like a gradual overuse injury, not a sudden accident. She notes pain/ discomfort in her right upper back between the spine and the scapula for about 4 weeks It is very tender to touch it hurts when she moves her arms around It hurts to hiccup - she has not  coughed that she can recall. She notes that she "almost passed out" when her derm was touching her to take a picture of a mole on this area, has been icing and has had an xray done is asking not to be touched , does get numbness and tingling sometimes when she is  weightlifting and sometimes just when she stands up.  Posterior right pain is not associated with eating and patient does not have right upper quadrant pain. ? ?05/22/2021 ?Patient states that she is better than when she came the first time she still feels a little sprain its not as bad as when she came in the meloxicam really helped, wants to know if still feeling the little pain is normal and how to fix it  ?  ?  ?Relevant Historical Information: On birth control ? ?Additional pertinent review of systems negative. ? ? ?Current Outpatient Medications:  ?  levonorgestrel-ethinyl estradiol (VIENVA) 0.1-20 MG-MCG tablet, Take 1 tablet by mouth daily., Disp: 84 tablet, Rfl: 3 ?  meloxicam (MOBIC) 15 MG tablet, Take 1 tablet (15 mg total) by mouth daily., Disp: 30 tablet, Rfl: 0 ?  methocarbamol (ROBAXIN) 500 MG tablet, Take 1 tablet (500 mg total) by mouth every 8 (eight) hours as needed for muscle spasms., Disp: 30 tablet, Rfl: 0 ?  omeprazole (PRILOSEC) 20 MG capsule, Take 1 capsule (20 mg total) by mouth daily., Disp: 90 capsule, Rfl: 3  ? ?Objective:   ?  ?Vitals:  ? 05/22/21 1453  ?BP: 102/80  ?Pulse: 88  ?SpO2: 97%  ?Weight: 126 lb (57.2 kg)  ?Height:  $'5\' 7"'f$  (1.702 m)  ?  ?  ?Body mass index is 19.73 kg/m?.  ?  ?Physical Exam:   ? ?General: Well-appearing, cooperative, sitting comfortably in no acute distress.  ? ?OMT Physical Exam: ? ?  ?Rib: Ribs 5-7 in inhalation on left ?Thoracic: TTP paraspinal, T5-7 RLSR, T3 RRSR ?Lumbar: TTP paraspinal, L1-3 RRSL ?TTP mildly right thoracic and lumbar paraspinal, right rhomboid ? ? ?Electronically signed by:  ?Sandra Richmond Sandra Richmond ?[facility] ?3:25 PM 05/22/21 ?

## 2021-05-22 ENCOUNTER — Ambulatory Visit: Payer: BC Managed Care – PPO | Admitting: Sports Medicine

## 2021-05-22 VITALS — BP 102/80 | HR 88 | Ht 67.0 in | Wt 126.0 lb

## 2021-05-22 DIAGNOSIS — M546 Pain in thoracic spine: Secondary | ICD-10-CM

## 2021-05-22 DIAGNOSIS — M9903 Segmental and somatic dysfunction of lumbar region: Secondary | ICD-10-CM | POA: Diagnosis not present

## 2021-05-22 DIAGNOSIS — S29012A Strain of muscle and tendon of back wall of thorax, initial encounter: Secondary | ICD-10-CM | POA: Diagnosis not present

## 2021-05-22 DIAGNOSIS — M9902 Segmental and somatic dysfunction of thoracic region: Secondary | ICD-10-CM

## 2021-05-22 DIAGNOSIS — M9908 Segmental and somatic dysfunction of rib cage: Secondary | ICD-10-CM

## 2021-05-22 NOTE — Patient Instructions (Addendum)
Good to see you  ?can take an additional 1 week of meloxicam daily  ?As needed follow up  ? ?

## 2021-06-22 DIAGNOSIS — B3731 Acute candidiasis of vulva and vagina: Secondary | ICD-10-CM | POA: Diagnosis not present

## 2021-07-03 DIAGNOSIS — J020 Streptococcal pharyngitis: Secondary | ICD-10-CM | POA: Diagnosis not present

## 2021-07-08 ENCOUNTER — Telehealth (INDEPENDENT_AMBULATORY_CARE_PROVIDER_SITE_OTHER): Payer: BC Managed Care – PPO | Admitting: Family Medicine

## 2021-07-08 DIAGNOSIS — R0989 Other specified symptoms and signs involving the circulatory and respiratory systems: Secondary | ICD-10-CM

## 2021-07-08 NOTE — Progress Notes (Signed)
[place] at [facility] [address] [place], [place] [address] [phone]  Date:  07/08/2021   Name:  Sandra Richmond   DOB:  05/21/2000   MRN:  [id number]  PCP:  Darreld Mclean, MD    Chief Complaint: No chief complaint on file.   History of Present Illness:  Sandra Richmond is a 21 y.o. very pleasant female patient who presents with the following:  Virtual visit today- pt notes that last week she noted discomfort in her left tonsil if she would yawn. It got worse so she went to [facility] near her college at [employer].  Told it was a "faint +" and started on amox which she is taking  She is feeling overall improved but still feels "a poking feeling" in her tonsil which will come and go She did have a stomach virus the week prior   No fever or cough   She also notes a "weird vibrating sensation in my left ear" which occurs just on occasion and may last 2-3 seconds.  She has noted it for 3-4 months No hearing change, no tinnitus She is not sure if this might be due to loud noise exposure  It occurs quite sporadically  Pt location is her car in [facility], my location is the office Pt ID confirmed with 2 factors, she gives consent for a virtual visit today Pt and myself are present on the call today Connected with pt via video monitor   She will be graduating from college soon- her plan is to come back to [place] for a couple of months and apply to grad school in occupational therapy   Patient Active Problem List   Diagnosis Date Noted   Appendicitis, acute 07/06/2013    Past Medical History:  Diagnosis Date   Anxiety    GERD (gastroesophageal reflux disease)    Urinary tract infection     Past Surgical History:  Procedure Laterality Date   APPENDECTOMY     LAPAROSCOPIC APPENDECTOMY N/A 07/06/2013   Procedure: APPENDECTOMY LAPAROSCOPIC;  Surgeon: Jerilynn Mages. Gerald Stabs, MD;  Location: [facility];  Service: Pediatrics;   Laterality: N/A;    Social History   Tobacco Use   Smoking status: Never   Smokeless tobacco: Never  Vaping Use   Vaping Use: Never used  Substance Use Topics   Alcohol use: No   Drug use: No    Family History  Problem Relation Age of Onset   Diabetes Paternal Grandmother    Diabetes Paternal Grandfather    Colon cancer Neg Hx     Allergies  Allergen Reactions   Diflucan [Fluconazole]     Felt strange- see ER note 03/02/17    Medication list has been reviewed and updated.  Current Outpatient Medications on File Prior to Visit  Medication Sig Dispense Refill   levonorgestrel-ethinyl estradiol (VIENVA) 0.1-20 MG-MCG tablet Take 1 tablet by mouth daily. 84 tablet 3   meloxicam (MOBIC) 15 MG tablet Take 1 tablet (15 mg total) by mouth daily. 30 tablet 0   methocarbamol (ROBAXIN) 500 MG tablet Take 1 tablet (500 mg total) by mouth every 8 (eight) hours as needed for muscle spasms. 30 tablet 0   omeprazole (PRILOSEC) 20 MG capsule Take 1 capsule (20 mg total) by mouth daily. 90 capsule 3   No current facility-administered medications on file prior to visit.    Review of Systems:  As per HPI- otherwise  negative.   Physical Examination: There were no vitals filed for this visit. There were no vitals filed for this visit. There is no height or weight on file to calculate BMI. Ideal Body Weight:    Pt observed Via MyChart video.  She looks well, her normal self.  No distress is noted. Attempted to examine her tonsils over video, exam is limited what I can see is normal  Assessment and Plan: Tonsil pain Virtual visit for concern of tonsil discomfort, patient describes "a poking feeling" Advised that my ability to evaluate this virtually is limited.  However, I do not think this is likely anything dangerous.  Okay to give it more time to resolve, if still bothering her in the next couple of weeks she will come in to be seen in person We also discussed the momentary  "vibrating feeling" she may notice in her left ear sporadically.  Certainly we are glad to have her see neurology if she would like.  The time being she plans observe, will let me know if getting worse Video used for duration of visit today  Signed Lamar Blinks, MD

## 2021-07-20 ENCOUNTER — Telehealth: Payer: Self-pay | Admitting: Sports Medicine

## 2021-07-20 NOTE — Telephone Encounter (Signed)
Patient called stating that she was seen on 4/14 but has had continued back pain. She asked what next steps would be?  Please advise.

## 2021-07-20 NOTE — Telephone Encounter (Signed)
Pt was called and told to follow up in clinic

## 2021-08-05 NOTE — Progress Notes (Signed)
Sandra Richmond D.[facility] Phone: [phone]   Assessment and Plan:     1. Chronic bilateral thoracic back pain 2. Low back pain, unspecified back pain laterality, unspecified chronicity, unspecified whether sciatica present 5. Scapular dyskinesis -Chronic with exacerbation, subsequent visit - Suspect that patient has underlying right-sided scapular dyskinesis that are leading to multiple musculoskeletal complaints along her right-sided back - Recommend starting HEP and physical therapy for scapular dyskinesis - Start meloxicam 15 mg daily x2 weeks.  If still having pain after 2 weeks, complete 3rd-week of meloxicam. May use remaining meloxicam as needed once daily for pain control.  Do not to use additional NSAIDs while taking meloxicam.  May use Tylenol [phone] mg 2 to 3 times a day for breakthrough pain.  3. Right elbow pain 4. Strain of right triceps, initial encounter -Acute, uncomplicated, initial sports medicine visit - Most consistent with strain of right triceps distal tendon based on physical exam and HPI - Start HEP - Start meloxicam 15 mg daily x2 weeks.  If still having pain after 2 weeks, complete 3rd-week of meloxicam. May use remaining meloxicam as needed once daily for pain control.  Do not to use additional NSAIDs while taking meloxicam.  May use Tylenol [phone] mg 2 to 3 times a day for breakthrough pain.    Pertinent previous records reviewed include none   Follow Up: 4 to 6 weeks for reevaluation   Subjective:   I, Sandra Richmond, am serving as a Education administrator for Doctor Glennon Mac   Chief Complaint: thoracic back pain    HPI:  04/13/2021 Patient is a 21 year old female complaining of thoracic pain. Patient states she hurt herself lifting weights but it seems like a gradual overuse injury, not a sudden accident. She notes pain/ discomfort in her right upper back between the spine and the scapula for  about 4 weeks It is very tender to touch it hurts when she moves her arms around It hurts to hiccup - she has not coughed that she can recall. She notes that she "almost passed out" when her derm was touching her to take a picture of a mole on this area, has been icing and has had an xray done is asking not to be touched , does get numbness and tingling sometimes when she is  weightlifting and sometimes just when she stands up.  Posterior right pain is not associated with eating and patient does not have right upper quadrant pain.   05/22/2021 Patient states that she is better than when she came the first time she still feels a little sprain its not as bad as when she came in the meloxicam really helped, wants to know if still feeling the little pain is normal and how to fix it    08/12/2021 Patient states that she is good but wants to have her back checked out again still feels a strain , did a lunge and heard a pop in her low back isnt able to sit or lay down with out pain, her right elbow locks with any weighted movement     Relevant Historical Information: On birth control  Additional pertinent review of systems negative.  Current Outpatient Medications  Medication Sig Dispense Refill   levonorgestrel-ethinyl estradiol (VIENVA) 0.1-20 MG-MCG tablet Take 1 tablet by mouth daily. 84 tablet 3   methocarbamol (ROBAXIN) 500 MG tablet Take 1 tablet (500 mg total) by mouth every 8 (eight) hours  as needed for muscle spasms. 30 tablet 0   omeprazole (PRILOSEC) 20 MG capsule Take 1 capsule (20 mg total) by mouth daily. 90 capsule 3   meloxicam (MOBIC) 15 MG tablet Take 1 tablet (15 mg total) by mouth daily. 30 tablet 0   No current facility-administered medications for this visit.      Objective:     Vitals:   08/12/21 1403  BP: 110/72  Pulse: 98  SpO2: 99%  Weight: 124 lb (56.2 kg)  Height: '5\' 7"'$  (1.702 m)      Body mass index is 19.42 kg/m.    Physical Exam:     Gen: Appears well,  nad, nontoxic and pleasant Psych: Alert and oriented, appropriate mood and affect Neuro: sensation intact, strength is 5/5 in upper and lower extremities, muscle tone wnl Skin: no susupicious lesions or rashes  Back - Normal skin, Spine with normal alignment and no deformity. Mild right scapular winging Popping sensation deep to scapula with right shoulder abduction No tenderness to vertebral process palpation.   Paraspinous muscles are not tender and without spasm TTP right rhomboids Straight leg raise negative    Right ELBOW: no deformity, swelling or muscle wasting Normal Carrying angle ROM:0-130, supination and pronation 90 TTP triceps tendon, olecranon NTTP over  olecronon, lat epicondyle, medial epicondyle, antecubital fossa, biceps tendon, supinator, pronator Negative tinnels over cubital tunnel No pain with resisted wrist and middle digit extension No pain with resisted wrist flexion   pain with resisted supination   pain with resisted pronation Negative valgus stress Negative varus stress Pain with resisted elbow extension  Electronically signed by:  Sandra Richmond D.[facility] 2:28 PM 08/12/21

## 2021-08-11 DIAGNOSIS — A0839 Other viral enteritis: Secondary | ICD-10-CM | POA: Diagnosis not present

## 2021-08-12 ENCOUNTER — Ambulatory Visit: Payer: BC Managed Care – PPO | Admitting: Sports Medicine

## 2021-08-12 ENCOUNTER — Ambulatory Visit: Payer: BC Managed Care – PPO

## 2021-08-12 VITALS — BP 110/72 | HR 98 | Ht 67.0 in | Wt 124.0 lb

## 2021-08-12 DIAGNOSIS — M25521 Pain in right elbow: Secondary | ICD-10-CM | POA: Diagnosis not present

## 2021-08-12 DIAGNOSIS — G2589 Other specified extrapyramidal and movement disorders: Secondary | ICD-10-CM

## 2021-08-12 DIAGNOSIS — S46311A Strain of muscle, fascia and tendon of triceps, right arm, initial encounter: Secondary | ICD-10-CM

## 2021-08-12 DIAGNOSIS — M545 Low back pain, unspecified: Secondary | ICD-10-CM | POA: Diagnosis not present

## 2021-08-12 DIAGNOSIS — G8929 Other chronic pain: Secondary | ICD-10-CM

## 2021-08-12 DIAGNOSIS — M546 Pain in thoracic spine: Secondary | ICD-10-CM

## 2021-08-12 MED ORDER — MELOXICAM 15 MG PO TABS: 15.0000 mg | ORAL_TABLET | Freq: Every day | ORAL | 0 refills | Status: DC

## 2021-08-12 NOTE — Patient Instructions (Addendum)
Good to see you  - Start meloxicam 15 mg daily x2 weeks.  If still having pain after 2 weeks, complete 3rd-week of meloxicam. May use remaining meloxicam as needed once daily for pain control.  Do not to use additional NSAIDs while taking meloxicam.  May use Tylenol [phone] 2 to 3 times a day for breakthrough pain.  Do prescribed exercises at least 3x a week PT Referral See you again in 4-6 weeks

## 2021-09-23 DIAGNOSIS — Z3202 Encounter for pregnancy test, result negative: Secondary | ICD-10-CM | POA: Diagnosis not present

## 2021-09-23 DIAGNOSIS — N3000 Acute cystitis without hematuria: Secondary | ICD-10-CM | POA: Diagnosis not present

## 2021-09-24 DIAGNOSIS — T300 Burn of unspecified body region, unspecified degree: Secondary | ICD-10-CM | POA: Diagnosis not present

## 2021-09-29 ENCOUNTER — Ambulatory Visit: Payer: BC Managed Care – PPO | Attending: Sports Medicine | Admitting: Physical Therapy

## 2021-09-29 ENCOUNTER — Encounter: Payer: Self-pay | Admitting: Physical Therapy

## 2021-09-29 DIAGNOSIS — G8929 Other chronic pain: Secondary | ICD-10-CM | POA: Diagnosis not present

## 2021-09-29 DIAGNOSIS — G2589 Other specified extrapyramidal and movement disorders: Secondary | ICD-10-CM | POA: Insufficient documentation

## 2021-09-29 DIAGNOSIS — M6281 Muscle weakness (generalized): Secondary | ICD-10-CM | POA: Insufficient documentation

## 2021-09-29 DIAGNOSIS — M25511 Pain in right shoulder: Secondary | ICD-10-CM | POA: Diagnosis not present

## 2021-09-29 DIAGNOSIS — R293 Abnormal posture: Secondary | ICD-10-CM | POA: Insufficient documentation

## 2021-09-29 NOTE — Therapy (Addendum)
OUTPATIENT PHYSICAL THERAPY SHOULDER EVALUATION / DISCHARGE SUMMARY   Patient Name: Sandra Richmond MRN: [id number] DOB:[birth year], 21 y.o., female Today's Date: 09/29/2021   PT End of Session - 09/29/21 1453     Visit Number 1    Number of Visits 17    Date for PT Re-Evaluation 11/24/21    Authorization Type BCBS    Authorization Time Period 09/29/21 to 1017/23    Authorization - Number of Visits 28   30/2 used at eval   PT Start Time 1403    PT Stop Time 1441    PT Time Calculation (min) 38 min    Activity Tolerance Patient tolerated treatment well    Behavior During Therapy [facility] for tasks assessed/performed             Past Medical History:  Diagnosis Date   Anxiety    GERD (gastroesophageal reflux disease)    Urinary tract infection    Past Surgical History:  Procedure Laterality Date   APPENDECTOMY     LAPAROSCOPIC APPENDECTOMY N/A 07/06/2013   Procedure: APPENDECTOMY LAPAROSCOPIC;  Surgeon: Jerilynn Mages. Gerald Stabs, MD;  Location: [place];  Service: Pediatrics;  Laterality: N/A;   Patient Active Problem List   Diagnosis Date Noted   Appendicitis, acute 07/06/2013    PCP: Cecile Hearing PROVIDER: Glennon Mac, DO   REFERRING DIAG: G25.89 (ICD-10-CM) - Scapular dyskinesis   THERAPY DIAG:  Chronic right shoulder pain  Muscle weakness (generalized)  Scapular dyskinesis  Abnormal posture  Rationale for Evaluation and Treatment Rehabilitation  ONSET DATE: 08/12/2021   SUBJECTIVE:                                                                                                                                                                                      SUBJECTIVE STATEMENT:  Not sure what exactly happened, there was never a moment when I was hitting upper body when I had a clear injury. When I was hitting back work I had pain between my spine and shoulder blade, then it changed to just being my shoulder blade. I used to get close to passing  out when people touched it, the sports med doctor told me to take some time off upper body lifting (took a week off then went back to lifting and just dropped the weight) and gave me some meloxicam. I'm not a power lifter so I don't lift super heavy but is over 30#. It was better but I was still having pain, saw the MD again and he did some soft tissue techniques, saw him again before my trip to [place]. Now it is just around the bottom part  of my right shoulder blade. When I lay on my right side its uncomfortable like I'm stretching my shoulder blade too much. Even if I lift like 5# it still feels like it is stretching, sometimes it pops too.  PERTINENT HISTORY: 1. Acute bilateral thoracic back pain 2. Strain of right rhomboid muscle 3. Somatic dysfunction of thoracic region 4. Somatic dysfunction of lumbar region 5. Somatic dysfunction of rib region -Acute, improving, subsequent visit - Overall moderate improvement in middle back pain most prominent along right rhomboid after 2-week course of meloxicam and HEP - Complete an additional 1 week course of meloxicam and then use remaining as needed - Continue HEP - Patient elected for initial OMT today.  Tolerated well per note below. - Decision today to treat with OMT was based on Physical Exam   After verbal consent patient was treated with HVLA (high velocity low amplitude), ME (muscle energy), FPR (flex positional release), ST (soft tissue), PC/PD (Pelvic Compression/ Pelvic Decompression) techniques in  , rib, thoracic, lumbar areas. Patient tolerated the procedure well with improvement in symptoms.  Patient educated on potential side effects of soreness and recommended to rest, hydrate, and use Tylenol as needed for pain control.    Pertinent previous records reviewed include none   Follow Up: As needed if no improving or worsening of symptoms.  Could consider physical therapy versus trigger point injections versus repeat OMT  PAIN:  Are you  having pain? Yes: NPRS scale: 6/10 Pain location: R shoulder blade  Pain description: sore sometimes, annoying  Aggravating factors: some exercise classes, some lifts,some stretches like childs pose  Relieving factors: not moving it much   PRECAUTIONS: None  WEIGHT BEARING RESTRICTIONS No  FALLS:  Has patient fallen in last 6 months? No  LIVING ENVIRONMENT: Lives with: lives with their family Lives in: House/apartment Stairs: one long stair case- sort of like split level  Has following equipment at home: None  OCCUPATION: Works as a Programme researcher, broadcasting/film/video   PLOF: Independent, Independent with basic ADLs, Independent with gait, and Independent with transfers  [place] heal my back and get back to lifting   OBJECTIVE:   DIAGNOSTIC FINDINGS:    PATIENT SURVEYS:  Quick Dash 25/100  COGNITION:  Overall cognitive status: Within functional limits for tasks assessed     SENSATION: WFL  POSTURE: Reduced lumbar lordosis, increased thoracic kyphosis, rounded shoulders, forward head; R scapula severely winged   UPPER EXTREMITY ROM:   Active ROM Right eval Left eval  Shoulder flexion WNL  WNL   Shoulder extension WNL  WNL   Shoulder abduction WNL  WNL   Shoulder adduction    Shoulder internal rotation FIR T4  FIR T4   Shoulder external rotation FER T4  FER T4   Elbow flexion    Elbow extension    Wrist flexion    Wrist extension    Wrist ulnar deviation    Wrist radial deviation    Wrist pronation    Wrist supination    (Blank rows = not tested)  UPPER EXTREMITY MMT:  MMT Right eval Left eval  Shoulder flexion 4+ 4+  Shoulder extension 3+ 3+  Shoulder abduction 4 4  Shoulder adduction    Shoulder internal rotation 4 4  Shoulder external rotation 4- 4-  Middle trapezius 3 3  Lower trapezius 3 3  Elbow flexion 4+ 4+  Elbow extension 3 3  Wrist flexion    Wrist extension    Wrist ulnar deviation  Wrist radial deviation    Wrist pronation    Wrist supination     Grip strength (lbs)    (Blank rows = not tested)  SHOULDER SPECIAL TESTS:  JOINT MOBILITY TESTING:  Thoracic PAs T7-T12 WNL, T2-T6 very stiff and immobile   PALPATION:  Rhomboids very tight and tender B, R rotator cuff with some trigger points    TODAY'S TREATMENT:   Thoracic extension stretch over towel roll Prone Ys x10 each with palms down then thumbs up Prone Ts x10 each with palms down then thumbs up Prone Ws x10   PATIENT EDUCATION: Education details: exam findings, POC, HEP  Person educated: Patient Education method: Explanation, Demonstration, and Handouts Education comprehension: verbalized understanding and returned demonstration   HOME EXERCISE PROGRAM:  [email or web address]  ASSESSMENT:  CLINICAL IMPRESSION: Patient is a 21 y.o. F who was seen today for physical therapy evaluation and treatment for scapular dyskinesis. Exam is with typical findings, note she does have quite a lot of R scapular winging at rest and severe dyskinesis with dynamic movements as well as postural limitations contributing to symptoms. Agree that muscle imbalance and overuse have probably played a large role here. Will benefit from skilled PT services to address limitations, reduce pain, and assist in return to safe lifting program moving forward.    OBJECTIVE IMPAIRMENTS decreased coordination, decreased mobility, decreased strength, increased fascial restrictions, impaired perceived functional ability, increased muscle spasms, impaired UE functional use, postural dysfunction, and pain.   ACTIVITY LIMITATIONS carrying, lifting, and reach over head  PARTICIPATION LIMITATIONS: community activity, occupation, and yard work  PERSONAL FACTORS Behavior pattern, Fitness, Past/current experiences, and Time since onset of injury/illness/exacerbation are also affecting patient's functional outcome.   REHAB POTENTIAL: Good  CLINICAL DECISION MAKING: Stable/uncomplicated  EVALUATION COMPLEXITY:  Low   GOALS: Goals reviewed with patient? Yes  SHORT TERM GOALS: Target date: 10/27/2021  (Remove Blue Hyperlink)  Will be compliant with appropriate progressive HEP  Baseline: Goal status: INITIAL  2.  Will demonstrate better understanding of posture and ergonomics  Baseline:  Goal status: INITIAL  3.  Pain to be no more than 4/10 at worst  Baseline:  Goal status: INITIAL  4.  Will demonstrate 50% improvement in R scapular positioning at rest  Baseline:  Goal status: INITIAL   LONG TERM GOALS: Target date: 11/24/2021  (Remove Blue Hyperlink)  MMT to improve by at least 1 grade in all weak groups  Baseline:  Goal status: INITIAL  2.  Will demonstrate no more than 25% impairment in proper scapular mechanics with dynamic overhead movements  Baseline:  Goal status: INITIAL  3.  Will be able to perform overhead lifts with free weights with good form and no increased pain around R scapula  Baseline:  Goal status: INITIAL  4.  Will be able to perform all functional tasks required for her job as a Programme researcher, broadcasting/film/video without increase in pain  Baseline:  Goal status: INITIAL    PLAN: PT FREQUENCY: 2x/week  PT DURATION: 8 weeks  PLANNED INTERVENTIONS: Therapeutic exercises, Therapeutic activity, Neuromuscular re-education, Balance training, Gait training, Patient/Family education, Self Care, Joint mobilization, Dry Needling, Electrical stimulation, Spinal mobilization, Cryotherapy, Moist heat, Taping, Ultrasound, Ionotophoresis 9m/ml Dexamethasone, Manual therapy, and Re-evaluation  PLAN FOR NEXT SESSION: posture training, periscap strengthening and coordination/endurance, DN/STM PRN, look at biomechanics of [place] lifting when ready    Solange Emry U PT DPT PN2  09/29/2021, 2:55 PM     PHYSICAL THERAPY DISCHARGE SUMMARY  Visits from Start of Care:  1  Current functional level related to goals / functional outcomes:   Refer to above clinical impression and goal assessment for status  as of last visit on 09/29/2021. Patient cancelled all follow-up treatment visits as she was traveling out of the country and did not return in >30 days, therefore will proceed with discharge from PT for this episode.     Remaining deficits:   As above. Pt seen only for eval visit.   Education / Equipment:   Initial HEP   Patient agrees to discharge. Patient goals were not met. Patient is being discharged due to not returning since the last visit.  Percival Spanish, PT, MPT 01/05/22, 10:23 AM  [facility] [place], [place], [address] Phone: [phone]   Fax:  [phone]

## 2021-10-01 ENCOUNTER — Ambulatory Visit: Payer: BC Managed Care – PPO | Admitting: Physical Therapy

## 2021-10-01 DIAGNOSIS — D2262 Melanocytic nevi of left upper limb, including shoulder: Secondary | ICD-10-CM | POA: Diagnosis not present

## 2021-10-01 DIAGNOSIS — D2261 Melanocytic nevi of right upper limb, including shoulder: Secondary | ICD-10-CM | POA: Diagnosis not present

## 2021-10-01 DIAGNOSIS — D225 Melanocytic nevi of trunk: Secondary | ICD-10-CM | POA: Diagnosis not present

## 2021-10-01 DIAGNOSIS — D2230 Melanocytic nevi of unspecified part of face: Secondary | ICD-10-CM | POA: Diagnosis not present

## 2021-10-06 ENCOUNTER — Encounter: Payer: BC Managed Care – PPO | Admitting: Physical Therapy

## 2021-10-08 ENCOUNTER — Encounter: Payer: BC Managed Care – PPO | Admitting: Physical Therapy

## 2021-10-14 ENCOUNTER — Encounter: Payer: BC Managed Care – PPO | Admitting: Physical Therapy

## 2021-10-22 ENCOUNTER — Encounter: Payer: BC Managed Care – PPO | Admitting: Physical Therapy

## 2021-10-27 ENCOUNTER — Encounter: Payer: BC Managed Care – PPO | Admitting: Physical Therapy

## 2021-11-28 NOTE — Progress Notes (Unsigned)
[place] [facility] [address], [place], [place] [address] [phone]  Date:  11/30/2021   Name:  Sandra Richmond   DOB:  10/18/2000   MRN:  [id number]  PCP:  Darreld Mclean, MD    Chief Complaint: No chief complaint on file.   History of Present Illness:  Sandra Richmond is a 21 y.o. very pleasant female patient who presents with the following:  Patient seen today for concern of reflux and heart palpitations Most recent visit with myself was a virtual visit in May for tonsillitis  Patient Active Problem List   Diagnosis Date Noted   Appendicitis, acute 07/06/2013    Past Medical History:  Diagnosis Date   Anxiety    GERD (gastroesophageal reflux disease)    Urinary tract infection     Past Surgical History:  Procedure Laterality Date   APPENDECTOMY     LAPAROSCOPIC APPENDECTOMY N/A 07/06/2013   Procedure: APPENDECTOMY LAPAROSCOPIC;  Surgeon: Jerilynn Mages. Gerald Stabs, MD;  Location: [place];  Service: Pediatrics;  Laterality: N/A;    Social History   Tobacco Use   Smoking status: Never   Smokeless tobacco: Never  Vaping Use   Vaping Use: Never used  Substance Use Topics   Alcohol use: No   Drug use: No    Family History  Problem Relation Age of Onset   Diabetes Paternal Grandmother    Diabetes Paternal Grandfather    Colon cancer Neg Hx     Allergies  Allergen Reactions   Diflucan [Fluconazole]     Felt strange- see ER note 03/02/17    Medication list has been reviewed and updated.  Current Outpatient Medications on File Prior to Visit  Medication Sig Dispense Refill   levonorgestrel-ethinyl estradiol (VIENVA) 0.1-20 MG-MCG tablet Take 1 tablet by mouth daily. 84 tablet 3   meloxicam (MOBIC) 15 MG tablet Take 1 tablet (15 mg total) by mouth daily. 30 tablet 0   methocarbamol (ROBAXIN) 500 MG tablet Take 1 tablet (500 mg total) by mouth every 8 (eight) hours as needed for muscle spasms. 30 tablet 0    omeprazole (PRILOSEC) 20 MG capsule Take 1 capsule (20 mg total) by mouth daily. 90 capsule 3   No current facility-administered medications on file prior to visit.    Review of Systems:  As per HPI- otherwise negative.   Physical Examination: There were no vitals filed for this visit. There were no vitals filed for this visit. There is no height or weight on file to calculate BMI. Ideal Body Weight:    GEN: no acute distress. HEENT: Atraumatic, Normocephalic.  Ears and Nose: No external deformity. CV: RRR, No M/G/R. No JVD. No thrill. No extra heart sounds. PULM: CTA B, no wheezes, crackles, rhonchi. No retractions. No resp. distress. No accessory muscle use. ABD: S, NT, ND, +BS. No rebound. No HSM. EXTR: No c/c/e PSYCH: Normally interactive. Conversant.    Assessment and Plan: ***  Signed Lamar Blinks, MD

## 2021-11-30 ENCOUNTER — Ambulatory Visit: Payer: BC Managed Care – PPO | Admitting: Family Medicine

## 2021-11-30 ENCOUNTER — Ambulatory Visit: Payer: BC Managed Care – PPO | Attending: Family Medicine

## 2021-11-30 VITALS — BP 122/60 | HR 85 | Temp 97.8°F | Resp 18 | Ht 67.0 in | Wt 124.8 lb

## 2021-11-30 DIAGNOSIS — R002 Palpitations: Secondary | ICD-10-CM

## 2021-11-30 DIAGNOSIS — Z131 Encounter for screening for diabetes mellitus: Secondary | ICD-10-CM | POA: Diagnosis not present

## 2021-11-30 DIAGNOSIS — R5383 Other fatigue: Secondary | ICD-10-CM

## 2021-11-30 DIAGNOSIS — N920 Excessive and frequent menstruation with regular cycle: Secondary | ICD-10-CM | POA: Diagnosis not present

## 2021-11-30 DIAGNOSIS — K219 Gastro-esophageal reflux disease without esophagitis: Secondary | ICD-10-CM

## 2021-11-30 MED ORDER — LEVONORGESTREL-ETHINYL ESTRAD 0.1-20 MG-MCG PO TABS: 1.0000 | ORAL_TABLET | Freq: Every day | ORAL | 1 refills | Status: DC

## 2021-11-30 NOTE — Patient Instructions (Addendum)
It was good to see you today Please come in for bloodwork at your convenience For your GERD- you might try an H2 blocker such as pepcid/ famotidine OTC.  If this controls your symptoms it may be better for long term use.  However if you need the omeprazole to prevent GERD it is ok to take it!    We will get you set up for the zio patch monitor- this will help [place] detect any abnormality in your heart rhythm

## 2021-11-30 NOTE — Progress Notes (Unsigned)
Enrolled for [employer] to mail a ZIO XT long term holter monitor to the patients address on file.   DOD to read. 

## 2021-12-01 ENCOUNTER — Telehealth: Payer: Self-pay | Admitting: Family Medicine

## 2021-12-01 NOTE — Telephone Encounter (Signed)
Tried calling the pt to call her pharmacy or insurance company to find this out.   Phone went straight to VM- vm box was full.

## 2021-12-01 NOTE — Telephone Encounter (Signed)
Patient states she received an email from her pharmacy stating there was a delay in refilling her Jacelyn Pi birth control due to her insurance. She would like to know what the delay is for. Please advise.

## 2021-12-04 ENCOUNTER — Other Ambulatory Visit: Payer: Self-pay

## 2021-12-04 DIAGNOSIS — N920 Excessive and frequent menstruation with regular cycle: Secondary | ICD-10-CM

## 2021-12-04 MED ORDER — LEVONORGESTREL-ETHINYL ESTRAD 0.1-20 MG-MCG PO TABS: 1.0000 | ORAL_TABLET | Freq: Every day | ORAL | 1 refills | Status: DC

## 2021-12-04 NOTE — Telephone Encounter (Signed)
Pt was given 33msupply but was requesting a 654mupply of rx because she will be going out of the country. She stated she got a call from pharmacy that ins is denying this as it is too early to fill rx. She would like to know if pcp able to do anything to get this filled.

## 2021-12-10 ENCOUNTER — Telehealth: Payer: Self-pay

## 2021-12-10 ENCOUNTER — Encounter: Payer: Self-pay | Admitting: Family Medicine

## 2021-12-10 NOTE — Telephone Encounter (Signed)
Number went straight to Vm and Vmbox was full.

## 2021-12-10 NOTE — Telephone Encounter (Signed)
Caller Name Carpio Phone Number [phone] Patient Name Sandra Richmond Patient DOB 2000/08/27 Call Type Message Only Information Provided Reason for Call Request for General Office Information Initial Comment Caller states she will be out of country for 4 months, states she needs 6 months worth of birth control but insurance is denying. Disp. Time Disposition Final User 12/10/2021 12:14:21 PM General Information Provided Yes Dara Hoyer Call Closed By: Dara Hoyer Transaction Date/Time: 12/10/2021 12:11:42 PM (ET)

## 2021-12-11 NOTE — Telephone Encounter (Signed)
See duplicate message.  ?

## 2021-12-12 DIAGNOSIS — R590 Localized enlarged lymph nodes: Secondary | ICD-10-CM | POA: Diagnosis not present

## 2021-12-12 DIAGNOSIS — L209 Atopic dermatitis, unspecified: Secondary | ICD-10-CM | POA: Diagnosis not present

## 2021-12-12 DIAGNOSIS — R55 Syncope and collapse: Secondary | ICD-10-CM | POA: Diagnosis not present

## 2021-12-15 ENCOUNTER — Ambulatory Visit: Payer: BC Managed Care – PPO | Admitting: Family

## 2021-12-15 VITALS — BP 114/68 | HR 78 | Temp 98.1°F | Resp 16 | Wt 126.0 lb

## 2021-12-15 DIAGNOSIS — G43909 Migraine, unspecified, not intractable, without status migrainosus: Secondary | ICD-10-CM | POA: Diagnosis not present

## 2021-12-15 MED ORDER — SUMATRIPTAN SUCCINATE 50 MG PO TABS: 50.0000 mg | ORAL_TABLET | ORAL | 0 refills | Status: DC | PRN

## 2021-12-15 NOTE — Assessment & Plan Note (Signed)
I suspect that her scalp tenderness is migraine related. Normal scalp/skull exam. She did have worsening scalp tenderness associated with headache/nausea. Will give her a trial of prn imitrex. She is advised to call if symptoms worsen or if symptoms do not improve. Pt verbalizes understanding.

## 2021-12-15 NOTE — Progress Notes (Signed)
Subjective:   By signing my name below, I, Carylon Perches, attest that this documentation has been prepared under the direction and in the presence of [place], NP 12/15/2021   Patient ID: Sandra Richmond, female    DOB: [birth year], 21 y.o.   MRN: [id number]  Chief Complaint  Patient presents with   Headache    Patient complains of tenderness on back of head for a few weeks. Denies any injury.     Headache    Patient is in today for an office visit  Tenderness on Back of Head: She complains of tenderness on the back of head. She has associated symptoms of headache and nausea. She states that symptoms are chronic but it felt more prominent as of this month. She states that the area feels as though she worse a ponytail for an extended period of time. She notes of a history of dandruff. She states that on 12/11/2021, the area was tender and she then developed a headache with associated nausea. Over the weekend, she states that while washing her hair, the tenderness persistent and she developed a headache but with decreased nausea. She has since been to an urgent and states that when the area was pressed on, she fainted but notes that it could have been due to anxiety. She was informed that her lymph node could have been tender at the time. She also states that her migraine reappeared after the visit. She denies of any recent head trauma. Has not history of chronic migraine.   Health Maintenance Due  Topic Date Due   HPV VACCINES (1 - 2-dose series) Never done   HIV Screening  Never done   Hepatitis C Screening  Never done   COVID-19 Vaccine (3 - Pfizer series) 09/01/2019   TETANUS/TDAP  09/27/2020   PAP-Cervical Cytology Screening  Never done   PAP SMEAR-Modifier  Never done   INFLUENZA VACCINE  09/08/2021    Past Medical History:  Diagnosis Date   Anxiety    GERD (gastroesophageal reflux disease)    Urinary tract infection     Past Surgical History:  Procedure  Laterality Date   APPENDECTOMY     LAPAROSCOPIC APPENDECTOMY N/A 07/06/2013   Procedure: APPENDECTOMY LAPAROSCOPIC;  Surgeon: Jerilynn Mages. Gerald Stabs, MD;  Location: [place];  Service: Pediatrics;  Laterality: N/A;    Family History  Problem Relation Age of Onset   Diabetes Paternal Grandmother    Diabetes Paternal Grandfather    Colon cancer Neg Hx     Social History   Socioeconomic History   Marital status: Single    Spouse name: Not on file   Number of children: Not on file   Years of education: Not on file   Highest education level: Not on file  Occupational History   Not on file  Tobacco Use   Smoking status: Never   Smokeless tobacco: Never  Vaping Use   Vaping Use: Never used  Substance and Sexual Activity   Alcohol use: No   Drug use: No   Sexual activity: Never    Birth control/protection: Abstinence  Other Topics Concern   Not on file  Social History Narrative   Not on file   Social Determinants of Health   Financial Resource Strain: Not on file  Food Insecurity: Not on file  Transportation Needs: Not on file  Physical Activity: Not on file  Stress: Not on file  Social Connections: Not on file  Intimate Partner Violence: Not on  file    Outpatient Medications Prior to Visit  Medication Sig Dispense Refill   levonorgestrel-ethinyl estradiol (VIENVA) 0.1-20 MG-MCG tablet Take 1 tablet by mouth daily. 168 tablet 1   omeprazole (PRILOSEC) 20 MG capsule Take 1 capsule (20 mg total) by mouth daily. 90 capsule 3   meloxicam (MOBIC) 15 MG tablet Take 1 tablet (15 mg total) by mouth daily. 30 tablet 0   No facility-administered medications prior to visit.    Allergies  Allergen Reactions   Diflucan [Fluconazole]     Felt strange- see ER note 03/02/17    Review of Systems  Musculoskeletal:        (+) Tenderness on Back of Head   Neurological:  Positive for headaches.       Objective:    Physical Exam Constitutional:      General: She is not in acute  distress.    Appearance: Normal appearance. She is well-developed.  HENT:     Head: Normocephalic and atraumatic. No masses.     Comments: No significant scalp tenderness noted    Right Ear: External ear normal.     Left Ear: External ear normal.  Eyes:     General: No scleral icterus. Neck:     Thyroid: No thyromegaly.  Cardiovascular:     Rate and Rhythm: Normal rate and regular rhythm.     Heart sounds: Normal heart sounds. No murmur heard. Pulmonary:     Effort: Pulmonary effort is normal. No respiratory distress.     Breath sounds: Normal breath sounds. No wheezing.  Musculoskeletal:     Cervical back: Neck supple.  Skin:    General: Skin is warm and dry.  Neurological:     Mental Status: She is alert and oriented to person, place, and time.  Psychiatric:        Mood and Affect: Mood normal.        Behavior: Behavior normal.        Thought Content: Thought content normal.        Judgment: Judgment normal.     BP 114/68 (BP Location: Right Arm, Patient Position: Sitting, Cuff Size: Small)   Pulse 78   Temp 98.1 F (36.7 C) (Oral)   Resp 16   Wt 126 lb (57.2 kg)   SpO2 100%   BMI 19.73 kg/m  Wt Readings from Last 3 Encounters:  12/15/21 126 lb (57.2 kg)  11/30/21 124 lb 12.8 oz (56.6 kg)  08/12/21 124 lb (56.2 kg)       Assessment & Plan:   Problem List Items Addressed This Visit       Unprioritized   Migraine without status migrainosus, not intractable - Primary    I suspect that her scalp tenderness is migraine related. Normal scalp/skull exam. She did have worsening scalp tenderness associated with headache/nausea. Will give her a trial of prn imitrex. She is advised to call if symptoms worsen or if symptoms do not improve. Pt verbalizes understanding.       Relevant Medications   SUMAtriptan (IMITREX) 50 MG tablet    Meds ordered this encounter  Medications   SUMAtriptan (IMITREX) 50 MG tablet    Sig: Take 1 tablet (50 mg total) by mouth every 2  (two) hours as needed for migraine. May repeat in 2 hours if headache persists or recurs.    Dispense:  10 tablet    Refill:  0    Order Specific Question:   Supervising Provider    Answer:  BLYTH, STACEY A [[id number]]    I, Nance Pear, NP, personally preformed the services described in this documentation.  All medical record entries made by the scribe were at my direction and in my presence.  I have reviewed the chart and discharge instructions (if applicable) and agree that the record reflects my personal performance and is accurate and complete. 12/15/2021   I,Amber Collins,acting as a scribe for Nance Pear, NP.,have documented all relevant documentation on the behalf of Nance Pear, NP,as directed by  Nance Pear, NP while in the presence of Nance Pear, NP.    Nance Pear, NP

## 2022-01-01 DIAGNOSIS — M25511 Pain in right shoulder: Secondary | ICD-10-CM | POA: Diagnosis not present

## 2022-01-01 DIAGNOSIS — M25611 Stiffness of right shoulder, not elsewhere classified: Secondary | ICD-10-CM | POA: Diagnosis not present

## 2022-01-01 DIAGNOSIS — M6281 Muscle weakness (generalized): Secondary | ICD-10-CM | POA: Diagnosis not present

## 2022-01-01 DIAGNOSIS — M546 Pain in thoracic spine: Secondary | ICD-10-CM | POA: Diagnosis not present

## 2022-01-01 NOTE — Progress Notes (Signed)
[place] at [facility] [address] [place], [place] [address] [phone]  Date:  01/04/2022   Name:  Sandra Richmond   DOB:  [birth year]   MRN:  [id number]  PCP:  Darreld Mclean, MD    Chief Complaint: Migraine (Pt complains of pain on the left back side of head. She says it is tender/bruised. She is getting headaches when she touches the area. The hair even grows different in that area. She wonders if there is a way to check for infection. /Concerns/ questions:1. reflux is worsening. 2. Pt would like to know if she can have some additional films taken of her cspine. )   History of Present Illness:  Sandra Richmond is a 21 y.o. very pleasant female patient who presents with the following:  Patient seen today with concern of back pain.  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  Flu shot- she declines  Can offer Pap smear- she is seeing her GYN next month   Today Pascale notes a tender area on the left side of her head for about a month It felt "like you hair is in a ponytail for too long" Touching it might cause a HA and it hurts to wash her hair   She was seen at an UC about a month ago and was told that "my lymph notes might be clogged" but when she was seen by our PA here nothing seemed to be wrong as far as her lymph nodes My partner, Debbrah Alar, PA-C thought this head pain was likely a type of headache She also feels like her hair grows "different" in the area of tenderness and that the texture is different She is concerned about some sort of infection She has  tried a few different shampoos The tenderness is getting better over time.  No particular headaches noted  Also- she is taking omeprazole 20 mg now She feels "a sizzling of a burn" in her throat when she has the GERD sx She did have an UGI about 3 years ago which showed some esophagitis  She would like to follow-up with GI, wonders about getting a referral \ Patient also notes that she has been seeing physical therapy for some pain in her thoracic spine.  She notes that when her therapist touches a certain area "I almost passed out" She notes her therapist would like some imaging of this area, we actually did thoracic spine films for her in February-normal result See my note from February 23 of this year-at that time she noted near syncope when her dermatologist touched the skin on her back to take a photo of a mole.  She also had apparent near syncope when I gently touched the area between her spine and right scapula Patient Active Problem List   Diagnosis Date Noted   Migraine without status migrainosus, not intractable 12/15/2021   Appendicitis, acute 07/06/2013    Past Medical History:  Diagnosis Date   Anxiety    GERD (gastroesophageal reflux disease)    Urinary tract infection     Past Surgical History:  Procedure Laterality Date   APPENDECTOMY     LAPAROSCOPIC APPENDECTOMY N/A 07/06/2013   Procedure: APPENDECTOMY LAPAROSCOPIC;  Surgeon: Jerilynn Mages. Gerald Stabs, MD;  Location: [place];  Service: Pediatrics;  Laterality: N/A;    Social History   Tobacco Use   Smoking status: Never   Smokeless tobacco: Never  Vaping Use   Vaping Use: Never used  Substance Use Topics   Alcohol use: No   Drug use: No    Family History  Problem Relation Age of Onset   Diabetes Paternal Grandmother    Diabetes Paternal Grandfather    Colon cancer Neg Hx     Allergies  Allergen Reactions   Diflucan [Fluconazole]     Felt strange- see ER note 03/02/17    Medication list has been reviewed  and updated.  Current Outpatient Medications on File Prior to Visit  Medication Sig Dispense Refill   levonorgestrel-ethinyl estradiol (VIENVA) 0.1-20 MG-MCG tablet Take 1 tablet by mouth daily. 168 tablet 1   omeprazole (PRILOSEC) 20 MG capsule Take 1 capsule (20 mg total) by mouth daily. 90 capsule 3   No current facility-administered medications on file prior to visit.    Review of Systems:  As per HPI- otherwise negative.   Physical Examination: Vitals:   01/04/22 1601  BP: 100/60  Pulse: 91  Resp: 18  Temp: 97.7 F (36.5 C)  SpO2: 98%   Vitals:   01/04/22 1601  Weight: 127 lb (57.6 kg)  Height: '5\' 7"'$  (1.702 m)   Body mass index is 19.89 kg/m. Ideal Body Weight: Weight in (lb) to have BMI = 25: 159.3  GEN: no acute distress.  Thin build, looks well HEENT: Atraumatic, Normocephalic.  Bilateral TM wnl, oropharynx normal.  PEERL,EOMI.   I have carefully examined her scalp in the area of concern, and the left crown of the head.  I am unable to appreciate any abnormality at this time Ears and Nose: No external deformity. CV: RRR, No M/G/R. No JVD. No thrill. No extra heart sounds. PULM: CTA B, no wheezes, crackles, rhonchi. No retractions. No resp. distress. No accessory muscle use. EXTR: No c/c/e PSYCH: Normally interactive. Conversant.    Assessment and Plan: Esophagitis - Plan: Ambulatory referral to Gastroenterology  Acute right-sided thoracic back pain  Pre-syncope  Scalp pain  Patient seen today with a few concerns.  She has history of chronic GERD, she did have an upper GI in 2020 which noted some esophagitis.  Placed referral back to gastroenterology for follow-up-for now continue omeprazole  She is seeing physical therapy for the tender area in her right back.  Provided copy of her x-rays from February she is also seen sports medicine for this issue  Discussed her scalp pain.  Apologize that I am not able to give her an exact diagnosis.  For now our  plan is to observe this area for about another month as it does seem to be getting better.  If it is still bothering her towards in December I will order a head CT  Signed Lamar Blinks, MD

## 2022-01-04 ENCOUNTER — Ambulatory Visit: Payer: BC Managed Care – PPO | Admitting: Family Medicine

## 2022-01-04 VITALS — BP 100/60 | HR 91 | Temp 97.7°F | Resp 18 | Ht 67.0 in | Wt 127.0 lb

## 2022-01-04 DIAGNOSIS — R55 Syncope and collapse: Secondary | ICD-10-CM

## 2022-01-04 DIAGNOSIS — K209 Esophagitis, unspecified without bleeding: Secondary | ICD-10-CM | POA: Diagnosis not present

## 2022-01-04 DIAGNOSIS — M546 Pain in thoracic spine: Secondary | ICD-10-CM | POA: Diagnosis not present

## 2022-01-04 DIAGNOSIS — R519 Headache, unspecified: Secondary | ICD-10-CM | POA: Diagnosis not present

## 2022-01-04 NOTE — Patient Instructions (Signed)
Referral back to GI Continue omeprazole for now Let me know if the sore area on your head does not continue to improve- we can get a CT scan if still bothering you towards Christmas time

## 2022-01-08 DIAGNOSIS — M546 Pain in thoracic spine: Secondary | ICD-10-CM | POA: Diagnosis not present

## 2022-01-08 DIAGNOSIS — M25511 Pain in right shoulder: Secondary | ICD-10-CM | POA: Diagnosis not present

## 2022-01-08 DIAGNOSIS — M6281 Muscle weakness (generalized): Secondary | ICD-10-CM | POA: Diagnosis not present

## 2022-01-08 DIAGNOSIS — M25611 Stiffness of right shoulder, not elsewhere classified: Secondary | ICD-10-CM | POA: Diagnosis not present

## 2022-01-11 DIAGNOSIS — Z6820 Body mass index (BMI) 20.0-20.9, adult: Secondary | ICD-10-CM | POA: Diagnosis not present

## 2022-01-11 DIAGNOSIS — Z124 Encounter for screening for malignant neoplasm of cervix: Secondary | ICD-10-CM | POA: Diagnosis not present

## 2022-01-11 DIAGNOSIS — Z01419 Encounter for gynecological examination (general) (routine) without abnormal findings: Secondary | ICD-10-CM | POA: Diagnosis not present

## 2022-01-11 DIAGNOSIS — N3281 Overactive bladder: Secondary | ICD-10-CM | POA: Diagnosis not present

## 2022-01-15 DIAGNOSIS — M6281 Muscle weakness (generalized): Secondary | ICD-10-CM | POA: Diagnosis not present

## 2022-01-15 DIAGNOSIS — M546 Pain in thoracic spine: Secondary | ICD-10-CM | POA: Diagnosis not present

## 2022-01-15 DIAGNOSIS — M25511 Pain in right shoulder: Secondary | ICD-10-CM | POA: Diagnosis not present

## 2022-01-15 DIAGNOSIS — M25611 Stiffness of right shoulder, not elsewhere classified: Secondary | ICD-10-CM | POA: Diagnosis not present

## 2022-03-04 ENCOUNTER — Encounter: Payer: Self-pay | Admitting: Family Medicine

## 2022-03-04 ENCOUNTER — Telehealth: Payer: Self-pay | Admitting: Family Medicine

## 2022-03-04 NOTE — Telephone Encounter (Signed)
Patient said she saw Dr. Lorelei Pont about tenderness in the back of her head and she said if it continued to let her know so she could order a MRI for her.Please call patient to advise when ordered so she can get it scheduled.

## 2022-03-08 ENCOUNTER — Encounter: Payer: Self-pay | Admitting: Gastroenterology

## 2022-03-08 ENCOUNTER — Telehealth: Payer: Self-pay | Admitting: Family Medicine

## 2022-03-08 DIAGNOSIS — K209 Esophagitis, unspecified without bleeding: Secondary | ICD-10-CM

## 2022-03-08 NOTE — Telephone Encounter (Signed)
Patient called to see if a referral can be sent to [facility]. Patient would like to be seen sooner than 2/29 if at all possible. Patient said If [facility] cannot see her earlier, then she Is okay to see someone else that Dr. Lorelei Pont recommends that may have earlier availability. Please call patient to advise of referral due [place] and being unable to connect to [employer].

## 2022-03-08 NOTE — Telephone Encounter (Signed)
Please advise 

## 2022-03-08 NOTE — Telephone Encounter (Signed)
[place] so she is unable to respond to message right now. She is okay to get CT scan if it will show whatever needs to be detected. Patient is unfamiliar with the difference between a CT and MRI and how deep the imaging goes or what it detects. Please give her a call since she cannot reply via mychart.

## 2022-03-09 NOTE — Telephone Encounter (Signed)
I called her back- straight to VM and VM full, could not LMOM.  Will have to try again later

## 2022-03-10 NOTE — Telephone Encounter (Signed)
I have placed a referral to [facility].  There was a MyChart message stating the patient was out of the country and could not access her MyChart.  I have called 3 times between yesterday and today, no answer and voicemail is full I will attempt contacting her via MyChart again

## 2022-03-10 NOTE — Telephone Encounter (Signed)
Please see the MyChart message reply(ies) for my assessment and plan.  The patient gave consent for this Medical Advice Message and is aware that it may result in a bill to their insurance company as well as the possibility that this may result in a co-payment or deductible. They are an established patient, but are not seeking medical advice exclusively about a problem treated during an in person or video visit in the last 7 days. I did not recommend an in person or video visit within 7 days of my reply.  I spent a total of 20 minutes cumulative time within 7 days through MyChart messaging-I have also called her several times as per her request but there is no answer and voicemail is full Lamar Blinks, MD

## 2022-03-10 NOTE — Telephone Encounter (Signed)
Patient called to follow up on referral. Advised it is pending review with her provider.

## 2022-03-10 NOTE — Telephone Encounter (Signed)
Called again yesterday and again now.  No answer, VM full

## 2022-03-12 NOTE — Progress Notes (Signed)
[place] at [facility] [address] [place], [place] [address] [phone]  Date:  03/17/2022   Name:  Sandra Richmond   DOB:  [birth year]   MRN:  [id number]  PCP:  Darreld Mclean, MD    Chief Complaint: No chief complaint on file.   History of Present Illness:  Sandra Richmond is a 22 y.o. very pleasant female patient who presents with the following:  Pt seen today for a recheck visit Last seen by myself 11/27: Patient seen today with a few concerns.  She has history of chronic GERD, she did have an upper GI in 2020 which noted some esophagitis.  Placed referral back to gastroenterology for follow-up-for now continue omeprazole She is seeing physical therapy for the tender area in her right back.  Provided copy of her x-rays from February she is also seen sports medicine for this issue Discussed her scalp pain.  Apologize that I am not able to give her an exact diagnosis.  For now our plan is to observe this area for about another month as it does seem to be getting better.  If it is still bothering her towards in December I will order a head CT  I made a GI referral but I don't think she has been seen as of yet; she contacted me again recently requesting referral to a different GI who could see her sooner    Patient Active Problem List   Diagnosis Date Noted   Migraine without status migrainosus, not intractable 12/15/2021   Appendicitis, acute 07/06/2013    Past Medical History:  Diagnosis Date   Anxiety    GERD (gastroesophageal reflux disease)    Urinary tract infection     Past Surgical History:  Procedure Laterality Date   APPENDECTOMY     LAPAROSCOPIC APPENDECTOMY N/A 07/06/2013   Procedure: APPENDECTOMY LAPAROSCOPIC;  Surgeon: Jerilynn Mages. Gerald Stabs, MD;  Location: [facility];  Service: Pediatrics;  Laterality: N/A;    Social History   Tobacco Use   Smoking status: Never   Smokeless tobacco: Never  Vaping Use   Vaping  Use: Never used  Substance Use Topics   Alcohol use: No   Drug use: No    Family History  Problem Relation Age of Onset   Diabetes Paternal Grandmother    Diabetes Paternal Grandfather    Colon cancer Neg Hx     Allergies  Allergen Reactions   Diflucan [Fluconazole]     Felt strange- see ER note 03/02/17    Medication list has been reviewed and updated.  Current Outpatient Medications on File Prior to Visit  Medication Sig Dispense Refill   levonorgestrel-ethinyl estradiol (VIENVA) 0.1-20 MG-MCG tablet Take 1 tablet by mouth daily. 168 tablet 1   omeprazole (PRILOSEC) 20 MG capsule Take 1 capsule (20 mg total) by mouth daily. 90 capsule 3   No current facility-administered medications on file prior to visit.    Review of Systems:  As per HPI- otherwise negative.   Physical Examination: There were no vitals filed for this visit. There were no vitals filed for this visit. There is no height or weight on file to calculate BMI. Ideal Body Weight:    GEN: no acute distress. HEENT: Atraumatic, Normocephalic.  Ears and Nose: No external deformity. CV: RRR, No M/G/R. No JVD. No thrill. No extra heart sounds. PULM: CTA B, no wheezes, crackles, rhonchi. No retractions. No resp. distress. No accessory muscle  use. ABD: S, NT, ND, +BS. No rebound. No HSM. EXTR: No c/c/e PSYCH: Normally interactive. Conversant.    Assessment and Plan: ***  Signed Lamar Blinks, MD

## 2022-03-12 NOTE — Patient Instructions (Incomplete)
Good to see you again today - I will reach out to Dr Barron Alvine and see if there is any way to get your visit moved up! In the meantime ok to continue omeprazole twice a day, add carafate about 4x a day (with meals and before bed) for 10 days Let me know how you do after you have your wisdom teeth out- we can do the head CT if your symptoms persist Ok to stop your birth control pill if you wish.  If you prefer to re-start later we can do so!

## 2022-03-17 ENCOUNTER — Encounter: Payer: Self-pay | Admitting: Family Medicine

## 2022-03-17 ENCOUNTER — Ambulatory Visit (INDEPENDENT_AMBULATORY_CARE_PROVIDER_SITE_OTHER): Payer: BC Managed Care – PPO | Admitting: Family Medicine

## 2022-03-17 VITALS — BP 118/62 | HR 95 | Resp 18 | Ht 67.0 in | Wt 121.4 lb

## 2022-03-17 DIAGNOSIS — Z3041 Encounter for surveillance of contraceptive pills: Secondary | ICD-10-CM

## 2022-03-17 DIAGNOSIS — K219 Gastro-esophageal reflux disease without esophagitis: Secondary | ICD-10-CM | POA: Diagnosis not present

## 2022-03-17 MED ORDER — SUCRALFATE 1 G PO TABS: 1.0000 g | ORAL_TABLET | Freq: Three times a day (TID) | ORAL | 0 refills | Status: DC

## 2022-03-24 ENCOUNTER — Other Ambulatory Visit: Payer: Self-pay | Admitting: Family Medicine

## 2022-03-24 DIAGNOSIS — M25511 Pain in right shoulder: Secondary | ICD-10-CM | POA: Diagnosis not present

## 2022-03-24 DIAGNOSIS — K219 Gastro-esophageal reflux disease without esophagitis: Secondary | ICD-10-CM

## 2022-03-24 DIAGNOSIS — M6281 Muscle weakness (generalized): Secondary | ICD-10-CM | POA: Diagnosis not present

## 2022-03-24 DIAGNOSIS — M546 Pain in thoracic spine: Secondary | ICD-10-CM | POA: Diagnosis not present

## 2022-03-24 DIAGNOSIS — M25611 Stiffness of right shoulder, not elsewhere classified: Secondary | ICD-10-CM | POA: Diagnosis not present

## 2022-03-26 ENCOUNTER — Telehealth: Payer: Self-pay | Admitting: Family Medicine

## 2022-03-26 ENCOUNTER — Telehealth: Payer: Self-pay | Admitting: Gastroenterology

## 2022-03-26 NOTE — Telephone Encounter (Signed)
Pt said that the medicine she was given did not help and she is still really nauseated and the inflammation is still there. Pt wanted to see if there is something else that can be called in to [facility] on [address]. Pt cannot get into mychart so please call her to advise.

## 2022-03-26 NOTE — Telephone Encounter (Signed)
Patient is calling stating her symptoms are worsening and says her medication is not helping and it is uncomfortable for her to move or sit and is losing appetite. Wondering if there is anything we can do, please advise.

## 2022-03-26 NOTE — Telephone Encounter (Signed)
Pt called back , made aware of suggestions and to call the GI office ... Stated she would call them and then call us back if they are unable to help .Marland KitchenMarland Kitchen

## 2022-03-26 NOTE — Telephone Encounter (Signed)
Tried to call pt back after calling the office after missing Dr.Copland's call , phone with voicemail and voicemail is full.

## 2022-03-26 NOTE — Telephone Encounter (Signed)
I am not sure what else to do for her at this point.  She is taking Carafate, I believe she is also taking twice daily PPI.  She has a GI appointment at the end of the month  I called her to discuss, no answer and voicemail is full Per patient notes she can no longer access her MyChart account

## 2022-03-26 NOTE — Telephone Encounter (Signed)
Pt called back- she will be around her phone for awhile. Can you call her back or is there anything you want me to relay to her?

## 2022-03-26 NOTE — Telephone Encounter (Signed)
Please advise 

## 2022-03-26 NOTE — Telephone Encounter (Signed)
Patient called back and said she was able to take a call now.  However when I called it went straight to VM- I think the phone is set on do not disturb.  She did clear her VM so I was able to leave a message.  Left message on machine stating that I believe she is taking a twice daily proton pump inhibitor plus Carafate right now.  Besides this I do not have any other great recommendations.  Certainly give her GI doctor a call and see if they can help.  We can see her next week if needed

## 2022-03-29 ENCOUNTER — Other Ambulatory Visit: Payer: Self-pay

## 2022-03-29 ENCOUNTER — Encounter (HOSPITAL_BASED_OUTPATIENT_CLINIC_OR_DEPARTMENT_OTHER): Payer: Self-pay | Admitting: Emergency Medicine

## 2022-03-29 ENCOUNTER — Emergency Department (HOSPITAL_BASED_OUTPATIENT_CLINIC_OR_DEPARTMENT_OTHER)
Admission: EM | Admit: 2022-03-29 | Discharge: 2022-03-30 | Disposition: A | Discharge status: Home / Self Care | Payer: BC Managed Care – PPO | Attending: Emergency Medicine | Admitting: Emergency Medicine

## 2022-03-29 DIAGNOSIS — K2900 Acute gastritis without bleeding: Secondary | ICD-10-CM

## 2022-03-29 DIAGNOSIS — R1013 Epigastric pain: Secondary | ICD-10-CM | POA: Diagnosis not present

## 2022-03-29 DIAGNOSIS — R110 Nausea: Secondary | ICD-10-CM | POA: Diagnosis not present

## 2022-03-29 DIAGNOSIS — R1011 Right upper quadrant pain: Secondary | ICD-10-CM | POA: Diagnosis not present

## 2022-03-29 DIAGNOSIS — Z3202 Encounter for pregnancy test, result negative: Secondary | ICD-10-CM | POA: Diagnosis not present

## 2022-03-29 DIAGNOSIS — K5900 Constipation, unspecified: Secondary | ICD-10-CM | POA: Diagnosis not present

## 2022-03-29 DIAGNOSIS — R1010 Upper abdominal pain, unspecified: Secondary | ICD-10-CM | POA: Diagnosis not present

## 2022-03-29 LAB — COMPREHENSIVE METABOLIC PANEL
ALT: 24 U/L (ref 0–44)
AST: 22 U/L (ref 15–41)
Albumin: 4.4 g/dL (ref 3.5–5.0)
Alkaline Phosphatase: 46 U/L (ref 38–126)
Anion gap: 8 (ref 5–15)
BUN: 10 mg/dL (ref 6–20)
CO2: 27 mmol/L (ref 22–32)
Calcium: 8.9 mg/dL (ref 8.9–10.3)
Chloride: 101 mmol/L (ref 98–111)
Creatinine, Ser: 0.85 mg/dL (ref 0.44–1.00)
GFR, Estimated: >60 mL/min (ref >60)
Glucose, Bld: 118 mg/dL — ABNORMAL HIGH (ref 70–99)
Potassium: 3 mmol/L — ABNORMAL LOW (ref 3.5–5.1)
Sodium: 136 mmol/L (ref 135–145)
Total Bilirubin: 1.3 mg/dL — ABNORMAL HIGH (ref 0.3–1.2)
Total Protein: 7.7 g/dL (ref 6.5–8.1)

## 2022-03-29 LAB — CBC
HCT: 40.2 % (ref 36.0–46.0)
Hemoglobin: 14.1 g/dL (ref 12.0–15.0)
MCH: 30.5 pg (ref 26.0–34.0)
MCHC: 35.1 g/dL (ref 30.0–36.0)
MCV: 87 fL (ref 80.0–100.0)
Platelets: 271 10*3/uL (ref 150–400)
RBC: 4.62 MIL/uL (ref 3.87–5.11)
RDW: 12.2 % (ref 11.5–15.5)
WBC: 7.9 10*3/uL (ref 4.0–10.5)
nRBC: 0 % (ref 0.0–0.2)

## 2022-03-29 LAB — URINALYSIS, MICROSCOPIC (REFLEX)

## 2022-03-29 LAB — URINALYSIS, ROUTINE W REFLEX MICROSCOPIC
Bilirubin Urine: NEGATIVE
Glucose, UA: NEGATIVE mg/dL
Ketones, ur: NEGATIVE mg/dL
Leukocytes,Ua: NEGATIVE
Nitrite: NEGATIVE
Protein, ur: NEGATIVE mg/dL
Specific Gravity, Urine: <=1.005 (ref 1.005–1.030)
pH: 6 (ref 5.0–8.0)

## 2022-03-29 LAB — PREGNANCY, URINE: Preg Test, Ur: NEGATIVE

## 2022-03-29 LAB — LIPASE, BLOOD: Lipase: 52 U/L — ABNORMAL HIGH (ref 11–51)

## 2022-03-29 NOTE — Telephone Encounter (Signed)
Left message for pt to call back to scheduled appointment with Dr. Lorelei Pont or one of the other provider.

## 2022-03-29 NOTE — Telephone Encounter (Signed)
Pt states she is not able to get into see GI until 2/29 and wondered if pcp could recommend anything maybe otc to help. She stated she has really bad nausea, right rib pain and inflammation and does not want to end up in the ER. Please advise.

## 2022-03-29 NOTE — Telephone Encounter (Signed)
Pt last seen in 2020. Pt advised to keep appointment with Dr. Bryan Lemma and follow recommendations of her PCP. If symptoms continue, pt advised to contact PCP or go to urgent care.

## 2022-03-29 NOTE — ED Triage Notes (Signed)
Pt states she has chronic acid reflux and GERD  Pt takes medication daily  Pt states she went to [place] Dec 18th and then about a month ago she went to [place]  Pt states she has been home for about 3 weeks  Pt states while she was on her trip her acid reflux and abd pain got worse than normal  She went to the ED while in [place] and had labs and an [place]   Pt has results with her  Pt states she has been having loose bowel movements in the color of yellow and green and has not had a normal BM since she was on her trip   Pt states she has lost 10 lbs in the past month   Pt has an appt with her GI dr on the 29th

## 2022-03-30 ENCOUNTER — Emergency Department (HOSPITAL_BASED_OUTPATIENT_CLINIC_OR_DEPARTMENT_OTHER): Payer: BC Managed Care – PPO

## 2022-03-30 DIAGNOSIS — K5900 Constipation, unspecified: Secondary | ICD-10-CM | POA: Diagnosis not present

## 2022-03-30 MED ORDER — LIDOCAINE VISCOUS HCL 2 % MT SOLN
15.0000 mL | Freq: Once | OROMUCOSAL | Status: AC
  Administered 2022-03-30: 15 mL via ORAL
  Filled 2022-03-30: qty 15

## 2022-03-30 MED ORDER — PANTOPRAZOLE SODIUM 40 MG PO TBEC: 40.0000 mg | DELAYED_RELEASE_TABLET | Freq: Every day | ORAL | 0 refills | Status: DC

## 2022-03-30 MED ORDER — ALUM & MAG HYDROXIDE-SIMETH 200-200-20 MG/5ML PO SUSP
30.0000 mL | Freq: Once | ORAL | Status: AC
  Administered 2022-03-30: 30 mL via ORAL
  Filled 2022-03-30: qty 30

## 2022-03-30 MED ORDER — IOHEXOL 300 MG/ML  SOLN
100.0000 mL | Freq: Once | INTRAMUSCULAR | Status: AC | PRN
  Administered 2022-03-30: 100 mL via INTRAVENOUS

## 2022-03-30 MED ORDER — SODIUM CHLORIDE 0.9 % IV BOLUS
1000.0000 mL | Freq: Once | INTRAVENOUS | Status: AC
  Administered 2022-03-30: 1000 mL via INTRAVENOUS

## 2022-03-30 MED ORDER — SUCRALFATE 1 G PO TABS: 1.0000 g | ORAL_TABLET | Freq: Three times a day (TID) | ORAL | 0 refills | Status: DC

## 2022-03-30 NOTE — ED Provider Notes (Signed)
[facility] Provider Note   CSN: [id number] Arrival date & time: 03/29/22  1953     History  Chief Complaint  Patient presents with   Abdominal Pain    Sandra Richmond is a 22 y.o. female.  Patient is a 22 year old female with past medical history of gastritis/GERD diagnosed by endoscopy in the past.  Patient presenting today with complaints of upper abdominal pain, pain with eating, and weight loss.  This has been worsening over the past month.  She traveled to [place] and [place] to visit family the end of December prior to the onset of symptoms.  She was seen at an emergency room in [place] and had an ultrasound and blood work performed, but no definitive cause was found.  She denies any fevers or chills.  She does state that her stool has been softer than normal and is green in color.  She denies alleviating factors.  Symptoms worse when she eats or drinks.  She describes a 10 pound weight loss during this period of time.  The history is provided by the patient.       Home Medications Prior to Admission medications   Medication Sig Start Date End Date Taking? Authorizing Provider  levonorgestrel-ethinyl estradiol (VIENVA) 0.1-20 MG-MCG tablet Take 1 tablet by mouth daily. 12/04/21   Copland, Gay Filler, MD  omeprazole (PRILOSEC) 20 MG capsule Take 1 capsule (20 mg total) by mouth daily. 11/29/18   Jackquline Denmark, MD  sucralfate (CARAFATE) 1 g tablet Take 1 tablet (1 g total) by mouth 4 (four) times daily -  with meals and at bedtime. 03/17/22   Copland, Gay Filler, MD      Allergies    Diflucan [fluconazole]    Review of Systems   Review of Systems  All other systems reviewed and are negative.   Physical Exam Updated Vital Signs BP 122/82 (BP Location: Left Arm)   Pulse 74   Temp 98.4 F (36.9 C) (Oral)   Resp 19   Ht 5' 7"$  (1.702 m)   Wt 53.5 kg   LMP 03/16/2022 (Exact Date)   SpO2 99%   BMI 18.48 kg/m  Physical  Exam Vitals and nursing note reviewed.  Constitutional:      General: She is not in acute distress.    Appearance: She is well-developed. She is not diaphoretic.  HENT:     Head: Normocephalic and atraumatic.  Cardiovascular:     Rate and Rhythm: Normal rate and regular rhythm.     Heart sounds: No murmur heard.    No friction rub. No gallop.  Pulmonary:     Effort: Pulmonary effort is normal. No respiratory distress.     Breath sounds: Normal breath sounds. No wheezing.  Abdominal:     General: Bowel sounds are normal. There is no distension.     Palpations: Abdomen is soft.     Tenderness: There is abdominal tenderness in the epigastric area. There is no right CVA tenderness, left CVA tenderness, guarding or rebound.  Musculoskeletal:        General: Normal range of motion.     Cervical back: Normal range of motion and neck supple.  Skin:    General: Skin is warm and dry.  Neurological:     General: No focal deficit present.     Mental Status: She is alert and oriented to person, place, and time.     ED Results / Procedures / Treatments  Labs (all labs ordered are listed, but only abnormal results are displayed) Labs Reviewed  LIPASE, BLOOD - Abnormal; Notable for the following components:      Result Value   Lipase 52 (*)    All other components within normal limits  COMPREHENSIVE METABOLIC PANEL - Abnormal; Notable for the following components:   Potassium 3.0 (*)    Glucose, Bld 118 (*)    Total Bilirubin 1.3 (*)    All other components within normal limits  URINALYSIS, ROUTINE W REFLEX MICROSCOPIC - Abnormal; Notable for the following components:   Hgb urine dipstick TRACE (*)    All other components within normal limits  URINALYSIS, MICROSCOPIC (REFLEX) - Abnormal; Notable for the following components:   Bacteria, UA FEW (*)    All other components within normal limits  CBC  PREGNANCY, URINE    EKG None  Radiology No results  found.  Procedures Procedures    Medications Ordered in ED Medications  sodium chloride 0.9 % bolus 1,000 mL (has no administration in time range)  alum & mag hydroxide-simeth (MAALOX/MYLANTA) 200-200-20 MG/5ML suspension 30 mL (has no administration in time range)    And  lidocaine (XYLOCAINE) 2 % viscous mouth solution 15 mL (has no administration in time range)    ED Course/ Medical Decision Making/ A&P  Patient is a 22 year old female with history of GERD presenting with complaints of abdominal pain as described in the HPI.  She arrives here with stable vital signs and physical examination which is revealing of epigastric tenderness that is mild, but no other abnormal findings.  Workup initiated including CBC, CMP, and lipase.  These were all basically unremarkable with the exception of a lipase of 52.  CT scan of the abdomen and pelvis obtained showing no acute intra-abdominal process.  This point, I suspect a worsening of the patient's GERD/gastritis.  I will have her stop taking omeprazole and prescribe Protonix.  I will also prescribe Carafate for her to take 4 times a day.  She is to follow-up with her GI doctor if not improving.  Nothing today appears surgical or emergent and I feel patient can safely be discharged.  Final Clinical Impression(s) / ED Diagnoses Final diagnoses:  None    Rx / DC Orders ED Discharge Orders     None         Veryl Speak, MD 03/30/22 0202

## 2022-03-30 NOTE — Telephone Encounter (Signed)
Looks like patient went to ED yesterday.

## 2022-03-30 NOTE — Telephone Encounter (Signed)
Left message on machine to call back to schedule with Copland or one of the other providers to be evaluated.

## 2022-03-30 NOTE — Discharge Instructions (Signed)
Begin taking Protonix and Carafate as prescribed.  Stop taking omeprazole.  Follow-up with your gastroenterologist if symptoms are not improving in the next week.

## 2022-03-31 DIAGNOSIS — K8050 Calculus of bile duct without cholangitis or cholecystitis without obstruction: Secondary | ICD-10-CM | POA: Diagnosis not present

## 2022-03-31 DIAGNOSIS — Z1330 Encounter for screening examination for mental health and behavioral disorders, unspecified: Secondary | ICD-10-CM | POA: Diagnosis not present

## 2022-03-31 DIAGNOSIS — R1013 Epigastric pain: Secondary | ICD-10-CM | POA: Diagnosis not present

## 2022-04-01 DIAGNOSIS — R1013 Epigastric pain: Secondary | ICD-10-CM | POA: Diagnosis not present

## 2022-04-01 DIAGNOSIS — R109 Unspecified abdominal pain: Secondary | ICD-10-CM | POA: Diagnosis not present

## 2022-04-05 DIAGNOSIS — R1013 Epigastric pain: Secondary | ICD-10-CM | POA: Diagnosis not present

## 2022-04-05 DIAGNOSIS — K8050 Calculus of bile duct without cholangitis or cholecystitis without obstruction: Secondary | ICD-10-CM | POA: Diagnosis not present

## 2022-04-05 DIAGNOSIS — K829 Disease of gallbladder, unspecified: Secondary | ICD-10-CM | POA: Diagnosis not present

## 2022-04-06 DIAGNOSIS — M6289 Other specified disorders of muscle: Secondary | ICD-10-CM | POA: Diagnosis not present

## 2022-04-06 DIAGNOSIS — R8290 Unspecified abnormal findings in urine: Secondary | ICD-10-CM | POA: Diagnosis not present

## 2022-04-06 DIAGNOSIS — N3281 Overactive bladder: Secondary | ICD-10-CM | POA: Diagnosis not present

## 2022-04-06 DIAGNOSIS — K219 Gastro-esophageal reflux disease without esophagitis: Secondary | ICD-10-CM | POA: Diagnosis not present

## 2022-04-06 DIAGNOSIS — R102 Pelvic and perineal pain: Secondary | ICD-10-CM | POA: Diagnosis not present

## 2022-04-06 DIAGNOSIS — J029 Acute pharyngitis, unspecified: Secondary | ICD-10-CM | POA: Diagnosis not present

## 2022-04-08 ENCOUNTER — Ambulatory Visit: Payer: BC Managed Care – PPO | Admitting: Gastroenterology

## 2022-04-08 DIAGNOSIS — K828 Other specified diseases of gallbladder: Secondary | ICD-10-CM | POA: Diagnosis not present

## 2022-04-08 DIAGNOSIS — R197 Diarrhea, unspecified: Secondary | ICD-10-CM | POA: Diagnosis not present

## 2022-04-08 DIAGNOSIS — R112 Nausea with vomiting, unspecified: Secondary | ICD-10-CM | POA: Diagnosis not present

## 2022-04-08 DIAGNOSIS — K829 Disease of gallbladder, unspecified: Secondary | ICD-10-CM | POA: Diagnosis not present

## 2022-04-08 DIAGNOSIS — Z79899 Other long term (current) drug therapy: Secondary | ICD-10-CM | POA: Diagnosis not present

## 2022-04-08 DIAGNOSIS — K811 Chronic cholecystitis: Secondary | ICD-10-CM | POA: Diagnosis not present

## 2022-04-08 DIAGNOSIS — K8010 Calculus of gallbladder with chronic cholecystitis without obstruction: Secondary | ICD-10-CM | POA: Diagnosis not present

## 2022-04-08 DIAGNOSIS — K219 Gastro-esophageal reflux disease without esophagitis: Secondary | ICD-10-CM | POA: Diagnosis not present

## 2022-04-21 ENCOUNTER — Telehealth: Payer: Self-pay | Admitting: Family Medicine

## 2022-04-21 DIAGNOSIS — G4452 New daily persistent headache (NDPH): Secondary | ICD-10-CM

## 2022-04-21 NOTE — Telephone Encounter (Signed)
Patient states she is needing immunization for school but the ones they asked for she thought she already had. Can we look this up for her?  Tdap   If not can we order this and get it done for her???    Patient also states that her and Copland have talk about head pain & a CT scan.. patient would like to go thru with this scan. Can we go ahead and put in the order?

## 2022-04-21 NOTE — Telephone Encounter (Signed)
After reviewing her chart and ncir, her last td/tdap was 09/28/2010.

## 2022-04-22 ENCOUNTER — Encounter: Payer: Self-pay | Admitting: Family Medicine

## 2022-04-22 NOTE — Addendum Note (Signed)
Addended by: Lamar Blinks C on: 04/22/2022 06:04 AM   Modules accepted: Orders

## 2022-04-22 NOTE — Addendum Note (Signed)
Addended by: CREFT, Kristine Garbe L on: 04/22/2022 05:01 PM   Modules accepted: Orders

## 2022-04-22 NOTE — Telephone Encounter (Signed)
She can certainly be scheduled for nurse visit for tetanus booster Td or Tdap-either 1-okay for her   Will also order a CT head no contrast on her request for chronic worsening headache  I will send patient a MyChart message, 2-year can you please give her a call and set up for her tetanus booster

## 2022-04-23 NOTE — Telephone Encounter (Signed)
Left vm to return call to set up tdap injection as NV.

## 2022-04-26 DIAGNOSIS — R070 Pain in throat: Secondary | ICD-10-CM | POA: Diagnosis not present

## 2022-04-26 DIAGNOSIS — J358 Other chronic diseases of tonsils and adenoids: Secondary | ICD-10-CM | POA: Diagnosis not present

## 2022-04-26 NOTE — Progress Notes (Unsigned)
Sandra Richmond is a 22 y.o. female presents to the office today for Tdap injections, per physician's orders. Original order: 04/21/2022 Tdap, 0.5 mg (dose),  IMwas administered *** Deltoid today. Patient tolerated injection. Patient due for follow up labs/provider appt: no   Creft, Frederick

## 2022-04-27 ENCOUNTER — Ambulatory Visit (INDEPENDENT_AMBULATORY_CARE_PROVIDER_SITE_OTHER): Payer: BC Managed Care – PPO

## 2022-04-27 DIAGNOSIS — Z23 Encounter for immunization: Secondary | ICD-10-CM

## 2022-04-27 NOTE — Progress Notes (Signed)
Sandra Richmond is a 22 y.o. female presents to the office today for Tdap injections, per physician's orders. Original order: 04/21/2022 Tdap, 0.5 mg (dose),  IMwas administered left Deltoid today. Patient tolerated injection. Patient due for follow up labs/provider appt: no  [place]

## 2022-04-30 ENCOUNTER — Encounter: Payer: Self-pay | Admitting: Family

## 2022-04-30 ENCOUNTER — Ambulatory Visit: Payer: BC Managed Care – PPO | Admitting: Family

## 2022-04-30 VITALS — BP 112/60 | HR 84 | Resp 18 | Ht 67.0 in | Wt 118.4 lb

## 2022-04-30 DIAGNOSIS — N920 Excessive and frequent menstruation with regular cycle: Secondary | ICD-10-CM | POA: Diagnosis not present

## 2022-04-30 DIAGNOSIS — K209 Esophagitis, unspecified without bleeding: Secondary | ICD-10-CM

## 2022-04-30 DIAGNOSIS — R1011 Right upper quadrant pain: Secondary | ICD-10-CM | POA: Diagnosis not present

## 2022-04-30 MED ORDER — LEVONORGESTREL-ETHINYL ESTRAD 0.1-20 MG-MCG PO TABS: 1.0000 | ORAL_TABLET | Freq: Every day | ORAL | 1 refills | Status: DC

## 2022-04-30 NOTE — Progress Notes (Signed)
Sandra Richmond is a 22 y.o. female with the following history as recorded in EpicCare:  Patient Active Problem List   Diagnosis Date Noted   Migraine without status migrainosus, not intractable 12/15/2021   Appendicitis, acute 07/06/2013    Current Outpatient Medications  Medication Sig Dispense Refill   pantoprazole (PROTONIX) 40 MG tablet Take 1 tablet (40 mg total) by mouth daily. 30 tablet 0   sucralfate (CARAFATE) 1 g tablet Take 1 tablet (1 g total) by mouth 4 (four) times daily -  with meals and at bedtime. 120 tablet 0   levonorgestrel-ethinyl estradiol (VIENVA) 0.1-20 MG-MCG tablet Take 1 tablet by mouth daily. 168 tablet 1   No current facility-administered medications for this visit.    Allergies: Diflucan [fluconazole]  Past Medical History:  Diagnosis Date   Anxiety    GERD (gastroesophageal reflux disease)    Urinary tract infection     Past Surgical History:  Procedure Laterality Date   APPENDECTOMY     LAPAROSCOPIC APPENDECTOMY N/A 07/06/2013   Procedure: APPENDECTOMY LAPAROSCOPIC;  Surgeon: Jerilynn Mages. Gerald Stabs, MD;  Location: [facility];  Service: Pediatrics;  Laterality: N/A;    Family History  Problem Relation Age of Onset   Diabetes Paternal Grandmother    Hypertension Paternal Grandfather    Diabetes Paternal Grandfather    Colon cancer Neg Hx     Social History   Tobacco Use   Smoking status: Never   Smokeless tobacco: Never  Substance Use Topics   Alcohol use: No    Subjective:  History of GERD- diagnosed approximately 3 years ago; had severe flare of symptoms in January 2024 while in [place]- was seen in [place] for emergent treatment with no relief; seen by her PCP in February and recommended to see GI; in the interim, was seen at ER and thought to have gallbladder symptoms; gallbladder was removed but symptoms have persisted;  Is actually scheduled to see GI on Monday but was concerned about episode of severe abdominal pain she experienced last night-  wanted to "talk through" her plan for treatment; Is currently taking Protonix 40 mg bid and Carafate bid;      Objective:  Vitals:   04/30/22 1119  BP: 112/60  Pulse: 84  Resp: 18  SpO2: 98%  Weight: 118 lb 6.4 oz (53.7 kg)  Height: 5\' 7"  (1.702 m)    General: Well developed, well nourished, in no acute distress  Skin : Warm and dry.  Head: Normocephalic and atraumatic  Eyes: Sclera and conjunctiva clear; pupils round and reactive to light; extraocular movements intact  Ears: External normal; canals clear; tympanic membranes normal  Oropharynx: Pink, supple. No suspicious lesions  Neck: Supple without thyromegaly, adenopathy  Lungs: Respirations unlabored; clear to auscultation bilaterally without wheeze, rales, rhonchi  CVS exam: normal rate and regular rhythm.  Abdomen: Soft; nontender; nondistended; normoactive bowel sounds; no masses or hepatosplenomegaly  Neurologic: Alert and oriented; speech intact; face symmetrical; moves all extremities well; CNII-XII intact without focal deficit   Assessment:  1. Esophagitis   2. Right upper quadrant abdominal pain   3. Menorrhagia with regular cycle     Plan:  Physical exam is reassuring; patient is already scheduled to see GI on Monday and she is aware of the necessity of keeping that appointment; she is also scheduled to see urogyn as question of endometriosis has been raised; she can try increasing her Carafate to qid over the weekend; discussed updating labs today but patient prefers to have  done with GI on Monday which is reasonable; strict Er precautions for the upcoming weekend.   Time spent 30 minutes reviewing history/ notes from recent gallbladder surgery and reviewing treatment plan  No follow-ups on file.  No orders of the defined types were placed in this encounter.   Requested Prescriptions   Signed Prescriptions Disp Refills   levonorgestrel-ethinyl estradiol (VIENVA) 0.1-20 MG-MCG tablet 168 tablet 1    Sig:  Take 1 tablet by mouth daily.

## 2022-05-03 DIAGNOSIS — R1013 Epigastric pain: Secondary | ICD-10-CM | POA: Diagnosis not present

## 2022-05-03 DIAGNOSIS — K921 Melena: Secondary | ICD-10-CM | POA: Diagnosis not present

## 2022-05-03 DIAGNOSIS — K219 Gastro-esophageal reflux disease without esophagitis: Secondary | ICD-10-CM | POA: Diagnosis not present

## 2022-05-04 DIAGNOSIS — R1013 Epigastric pain: Secondary | ICD-10-CM | POA: Diagnosis not present

## 2022-05-04 DIAGNOSIS — K219 Gastro-esophageal reflux disease without esophagitis: Secondary | ICD-10-CM | POA: Diagnosis not present

## 2022-05-04 DIAGNOSIS — K921 Melena: Secondary | ICD-10-CM | POA: Diagnosis not present

## 2022-05-05 DIAGNOSIS — K219 Gastro-esophageal reflux disease without esophagitis: Secondary | ICD-10-CM | POA: Diagnosis not present

## 2022-05-05 DIAGNOSIS — F411 Generalized anxiety disorder: Secondary | ICD-10-CM | POA: Diagnosis not present

## 2022-05-05 DIAGNOSIS — R1013 Epigastric pain: Secondary | ICD-10-CM | POA: Diagnosis not present

## 2022-05-05 DIAGNOSIS — K921 Melena: Secondary | ICD-10-CM | POA: Diagnosis not present

## 2022-05-06 DIAGNOSIS — R1013 Epigastric pain: Secondary | ICD-10-CM | POA: Diagnosis not present

## 2022-05-06 DIAGNOSIS — K921 Melena: Secondary | ICD-10-CM | POA: Diagnosis not present

## 2022-05-06 DIAGNOSIS — K2950 Unspecified chronic gastritis without bleeding: Secondary | ICD-10-CM | POA: Diagnosis not present

## 2022-05-06 DIAGNOSIS — K219 Gastro-esophageal reflux disease without esophagitis: Secondary | ICD-10-CM | POA: Diagnosis not present

## 2022-05-06 LAB — HM COLONOSCOPY

## 2022-05-13 DIAGNOSIS — F411 Generalized anxiety disorder: Secondary | ICD-10-CM | POA: Diagnosis not present

## 2022-05-17 ENCOUNTER — Telehealth: Payer: Self-pay

## 2022-05-17 ENCOUNTER — Encounter: Payer: Self-pay | Admitting: Family Medicine

## 2022-05-17 DIAGNOSIS — K209 Esophagitis, unspecified without bleeding: Secondary | ICD-10-CM

## 2022-05-17 NOTE — Telephone Encounter (Signed)
Pt called requesting referral to allergist. She states that she has spoken to Dr. Patsy Lager about this before.

## 2022-05-19 DIAGNOSIS — F411 Generalized anxiety disorder: Secondary | ICD-10-CM | POA: Diagnosis not present

## 2022-05-20 DIAGNOSIS — R102 Pelvic and perineal pain: Secondary | ICD-10-CM | POA: Diagnosis not present

## 2022-05-20 DIAGNOSIS — D485 Neoplasm of uncertain behavior of skin: Secondary | ICD-10-CM | POA: Diagnosis not present

## 2022-05-20 DIAGNOSIS — N946 Dysmenorrhea, unspecified: Secondary | ICD-10-CM | POA: Diagnosis not present

## 2022-05-20 DIAGNOSIS — N3281 Overactive bladder: Secondary | ICD-10-CM | POA: Diagnosis not present

## 2022-05-20 DIAGNOSIS — M6289 Other specified disorders of muscle: Secondary | ICD-10-CM | POA: Diagnosis not present

## 2022-05-20 DIAGNOSIS — N809 Endometriosis, unspecified: Secondary | ICD-10-CM | POA: Diagnosis not present

## 2022-05-20 DIAGNOSIS — L089 Local infection of the skin and subcutaneous tissue, unspecified: Secondary | ICD-10-CM | POA: Diagnosis not present

## 2022-05-20 DIAGNOSIS — R300 Dysuria: Secondary | ICD-10-CM | POA: Diagnosis not present

## 2022-05-20 DIAGNOSIS — D2222 Melanocytic nevi of left ear and external auricular canal: Secondary | ICD-10-CM | POA: Diagnosis not present

## 2022-05-21 ENCOUNTER — Ambulatory Visit
Admission: RE | Admit: 2022-05-21 | Discharge: 2022-05-21 | Disposition: A | Discharge status: Home / Self Care | Payer: BC Managed Care – PPO | Source: Ambulatory Visit | Attending: Family Medicine | Admitting: Family Medicine

## 2022-05-21 DIAGNOSIS — R110 Nausea: Secondary | ICD-10-CM | POA: Diagnosis not present

## 2022-05-21 DIAGNOSIS — K219 Gastro-esophageal reflux disease without esophagitis: Secondary | ICD-10-CM | POA: Diagnosis not present

## 2022-05-21 DIAGNOSIS — G4452 New daily persistent headache (NDPH): Secondary | ICD-10-CM

## 2022-05-21 DIAGNOSIS — R1013 Epigastric pain: Secondary | ICD-10-CM | POA: Diagnosis not present

## 2022-05-21 DIAGNOSIS — R519 Headache, unspecified: Secondary | ICD-10-CM | POA: Diagnosis not present

## 2022-05-22 ENCOUNTER — Encounter: Payer: Self-pay | Admitting: Family Medicine

## 2022-05-24 DIAGNOSIS — R1013 Epigastric pain: Secondary | ICD-10-CM | POA: Diagnosis not present

## 2022-05-24 DIAGNOSIS — K219 Gastro-esophageal reflux disease without esophagitis: Secondary | ICD-10-CM | POA: Diagnosis not present

## 2022-05-24 DIAGNOSIS — R110 Nausea: Secondary | ICD-10-CM | POA: Diagnosis not present

## 2022-05-24 DIAGNOSIS — R6881 Early satiety: Secondary | ICD-10-CM | POA: Diagnosis not present

## 2022-05-27 DIAGNOSIS — F411 Generalized anxiety disorder: Secondary | ICD-10-CM | POA: Diagnosis not present

## 2022-05-31 ENCOUNTER — Telehealth: Payer: Self-pay

## 2022-05-31 NOTE — Telephone Encounter (Signed)
Contacted pt.-LVM

## 2022-05-31 NOTE — Progress Notes (Deleted)
05/31/2022 Francoise Ceo [id number] 2000-11-22  Referring provider: Pearline Cables, MD Primary GI doctor: {acdocs:[id number]}  ASSESSMENT AND PLAN:   There are no diagnoses linked to this encounter.   Patient Care Team: Copland, Gwenlyn Found, MD as PCP - General (Family Medicine)  HISTORY OF PRESENT ILLNESS: 22 y.o. female with a past medical history of ***and others listed below presents for evaluation of ***.   Patient {Actions; denies-reports:120008} family history of colon cancer or other gastrointestinal malignancies.   She {Actions; denies-reports:120008} blood thinner use.  She {Actions; denies-reports:120008} NSAID use.  She {Actions; denies-reports:120008} ETOH use.   She {Actions; denies-reports:120008} tobacco use.  She {Actions; denies-reports:120008} drug use.    She  reports that she has never smoked. She has never used smokeless tobacco. She reports that she does not drink alcohol and does not use drugs.  RELEVANT LABS AND IMAGING: CBC    Component Value Date/Time   WBC 7.9 03/29/2022 2028   RBC 4.62 03/29/2022 2028   HGB 14.1 03/29/2022 2028   HGB 14.2 08/21/2018 1538   HCT 40.2 03/29/2022 2028   HCT 41.5 08/21/2018 1538   PLT 271 03/29/2022 2028   PLT 272 08/21/2018 1538   MCV 87.0 03/29/2022 2028   MCV 89 08/21/2018 1538   MCH 30.5 03/29/2022 2028   MCHC 35.1 03/29/2022 2028   RDW 12.2 03/29/2022 2028   RDW 11.8 08/21/2018 1538   LYMPHSABS 3,230 08/03/2019 1419   LYMPHSABS 3.2 (H) 08/21/2018 1538   MONOABS 0.9 03/02/2017 2229   EOSABS 70 08/03/2019 1419   EOSABS 0.1 08/21/2018 1538   BASOSABS 44 08/03/2019 1419   BASOSABS 0.1 08/21/2018 1538   Recent Labs    03/29/22 2028  HGB 14.1    CMP     Component Value Date/Time   NA 136 03/29/2022 2028   NA 141 08/21/2018 1538   K 3.0 (L) 03/29/2022 2028   CL 101 03/29/2022 2028   CO2 27 03/29/2022 2028   GLUCOSE 118 (H) 03/29/2022 2028   BUN 10 03/29/2022 2028   BUN 13 08/21/2018 1538    CREATININE 0.85 03/29/2022 2028   CALCIUM 8.9 03/29/2022 2028   PROT 7.7 03/29/2022 2028   PROT 7.3 08/21/2018 1538   ALBUMIN 4.4 03/29/2022 2028   ALBUMIN 4.7 08/21/2018 1538   AST 22 03/29/2022 2028   ALT 24 03/29/2022 2028   ALKPHOS 46 03/29/2022 2028   BILITOT 1.3 (H) 03/29/2022 2028   BILITOT 1.0 08/21/2018 1538   GFRNONAA >60 03/29/2022 2028   GFRAA 109 08/21/2018 1538      Latest Ref Rng & Units 03/29/2022    8:28 PM 08/21/2018    3:38 PM 07/05/2013    7:54 PM  Hepatic Function  Total Protein 6.5 - 8.1 g/dL 7.7  7.3  8.4   Albumin 3.5 - 5.0 g/dL 4.4  4.7  4.7   AST 15 - 41 U/L ALT 0 - 44 U/L Alk Phosphatase 38 - 126 U/L 46  66  120   Total Bilirubin 0.3 - 1.2 mg/dL 1.3  1.0  1.7   Bilirubin, Direct 0.0 - 0.3 mg/dL   0.2       Current Medications:   Current Outpatient Medications (Endocrine & Metabolic):    levonorgestrel-ethinyl estradiol (VIENVA) 0.1-20 MG-MCG tablet, Take 1 tablet by mouth daily.      Current Outpatient Medications (Other):  pantoprazole (PROTONIX) 40 MG tablet, Take 1 tablet (40 mg total) by mouth daily.   sucralfate (CARAFATE) 1 g tablet, Take 1 tablet (1 g total) by mouth 4 (four) times daily -  with meals and at bedtime.  Medical History:  Past Medical History:  Diagnosis Date   Anxiety    GERD (gastroesophageal reflux disease)    Urinary tract infection    Allergies:  Allergies  Allergen Reactions   Diflucan [Fluconazole]     Felt strange- see ER note 03/02/17     Surgical History:  She  has a past surgical history that includes laparoscopic appendectomy (N/A, 07/06/2013) and Appendectomy. Family History:  Her family history includes Diabetes in her paternal grandfather and paternal grandmother; Hypertension in her paternal grandfather.  REVIEW OF SYSTEMS  : All other systems reviewed and negative except where noted in the History of Present Illness.  PHYSICAL EXAM: There were no vitals taken for  this visit. General Appearance: Well nourished, in no apparent distress. Head:   Normocephalic and atraumatic. Eyes:  sclerae anicteric,conjunctive pink  Respiratory: Respiratory effort normal, BS equal bilaterally without rales, rhonchi, wheezing. Cardio: RRR with no MRGs. Peripheral pulses intact.  Abdomen: Soft,  {BlankSingle:19197::"Flat","Obese","Non-distended"} ,active bowel sounds. {actendernessAB:27319} tenderness {anatomy; site abdomen:5010}. {BlankMultiple:19196::"Without guarding","With guarding","Without rebound","With rebound"}. No masses. Rectal: {acrectalexam:27461} Musculoskeletal: Full ROM, {PSY - GAIT AND STATION:22860} gait. {With/Without:304960234} edema. Skin:  Dry and intact without significant lesions or rashes Neuro: Alert and  oriented x4;  No focal deficits. Psych:  Cooperative. Normal mood and affect.    Doree Albee, PA-C 1:15 PM

## 2022-06-01 ENCOUNTER — Telehealth: Payer: Self-pay

## 2022-06-01 ENCOUNTER — Telehealth: Payer: Self-pay | Admitting: Gastroenterology

## 2022-06-01 NOTE — Telephone Encounter (Signed)
Hi Dr. Barron Alvine,  We received a referral for patient to be evaluated for Esophagitis and she is an established patient of yours. The patient stated she has seen someone at [facility] for nausea, rib cage pain, stomach inflammation and is seeking a second opinion because nothing is helping her. She said she has done several testings and gall bladder removed. Records are in Epic for review.  Please advise on scheduling. Thanks

## 2022-06-01 NOTE — Telephone Encounter (Signed)
Inbound call from patient returning call. Please advise. 

## 2022-06-01 NOTE — Telephone Encounter (Signed)
Message sent to Dr. Tomasa Rand (DOD) to review patient's records who wants to be seen for a second opinion

## 2022-06-01 NOTE — Telephone Encounter (Signed)
Patient was recently seen by [facility] and had a full work up including and ECL and gastric emptying scan (can be seen in care everywhere).  She would like to be seen here for a second opinion.  Please review her records and advise.  Thank you!

## 2022-06-02 ENCOUNTER — Ambulatory Visit: Payer: BC Managed Care – PPO | Admitting: Physician Assistant

## 2022-06-03 DIAGNOSIS — K30 Functional dyspepsia: Secondary | ICD-10-CM | POA: Diagnosis not present

## 2022-06-03 DIAGNOSIS — F411 Generalized anxiety disorder: Secondary | ICD-10-CM | POA: Diagnosis not present

## 2022-06-03 DIAGNOSIS — K219 Gastro-esophageal reflux disease without esophagitis: Secondary | ICD-10-CM | POA: Diagnosis not present

## 2022-06-04 NOTE — Telephone Encounter (Signed)
Will you please call this patient and let her know that Dr. Barron Alvine thinks it is best if she stays with [facility] and continues the work up they have initiated.  You can look at his recent phone note  - the last paragraph sort of outlines it.

## 2022-06-08 DIAGNOSIS — M6289 Other specified disorders of muscle: Secondary | ICD-10-CM | POA: Diagnosis not present

## 2022-06-08 DIAGNOSIS — N3281 Overactive bladder: Secondary | ICD-10-CM | POA: Diagnosis not present

## 2022-06-08 DIAGNOSIS — R102 Pelvic and perineal pain: Secondary | ICD-10-CM | POA: Diagnosis not present

## 2022-06-09 DIAGNOSIS — F411 Generalized anxiety disorder: Secondary | ICD-10-CM | POA: Diagnosis not present

## 2022-06-17 DIAGNOSIS — F411 Generalized anxiety disorder: Secondary | ICD-10-CM | POA: Diagnosis not present

## 2022-06-21 DIAGNOSIS — R102 Pelvic and perineal pain: Secondary | ICD-10-CM | POA: Diagnosis not present

## 2022-06-21 DIAGNOSIS — M6289 Other specified disorders of muscle: Secondary | ICD-10-CM | POA: Diagnosis not present

## 2022-06-21 DIAGNOSIS — G8929 Other chronic pain: Secondary | ICD-10-CM | POA: Diagnosis not present

## 2022-06-21 DIAGNOSIS — N3281 Overactive bladder: Secondary | ICD-10-CM | POA: Diagnosis not present

## 2022-07-01 DIAGNOSIS — F411 Generalized anxiety disorder: Secondary | ICD-10-CM | POA: Diagnosis not present

## 2022-07-05 DIAGNOSIS — R350 Frequency of micturition: Secondary | ICD-10-CM | POA: Diagnosis not present

## 2022-07-05 DIAGNOSIS — R3915 Urgency of urination: Secondary | ICD-10-CM | POA: Diagnosis not present

## 2022-07-05 DIAGNOSIS — R300 Dysuria: Secondary | ICD-10-CM | POA: Diagnosis not present

## 2022-07-06 ENCOUNTER — Telehealth: Payer: Self-pay | Admitting: Family Medicine

## 2022-07-06 ENCOUNTER — Other Ambulatory Visit: Payer: Self-pay

## 2022-07-06 DIAGNOSIS — Z111 Encounter for screening for respiratory tuberculosis: Secondary | ICD-10-CM

## 2022-07-06 NOTE — Telephone Encounter (Signed)
Tried calling to schedule- lvm. PPD order has been placed.

## 2022-07-06 NOTE — Telephone Encounter (Signed)
Okay for order?

## 2022-07-06 NOTE — Telephone Encounter (Signed)
My chart message sent as well

## 2022-07-06 NOTE — Telephone Encounter (Signed)
Pt called requesting a TB skin test order be put in for work-related purposes.

## 2022-07-07 ENCOUNTER — Ambulatory Visit (INDEPENDENT_AMBULATORY_CARE_PROVIDER_SITE_OTHER): Payer: BC Managed Care – PPO | Admitting: Neurology

## 2022-07-07 DIAGNOSIS — Z0184 Encounter for antibody response examination: Secondary | ICD-10-CM | POA: Diagnosis not present

## 2022-07-07 DIAGNOSIS — N946 Dysmenorrhea, unspecified: Secondary | ICD-10-CM | POA: Diagnosis not present

## 2022-07-07 DIAGNOSIS — N3281 Overactive bladder: Secondary | ICD-10-CM | POA: Diagnosis not present

## 2022-07-07 DIAGNOSIS — N941 Unspecified dyspareunia: Secondary | ICD-10-CM | POA: Diagnosis not present

## 2022-07-07 DIAGNOSIS — R300 Dysuria: Secondary | ICD-10-CM | POA: Diagnosis not present

## 2022-07-07 NOTE — Progress Notes (Signed)
Patient is here for PPD placement.   Patient tolerated injection well without complications.   Has appointment for reading 07/09/2022.

## 2022-07-08 ENCOUNTER — Telehealth: Payer: Self-pay | Admitting: Family Medicine

## 2022-07-08 ENCOUNTER — Encounter: Payer: Self-pay | Admitting: Family Medicine

## 2022-07-08 DIAGNOSIS — Z7721 Contact with and (suspected) exposure to potentially hazardous body fluids: Secondary | ICD-10-CM

## 2022-07-08 NOTE — Telephone Encounter (Signed)
Pt is scheduled to come in tomorrow to get her TB skin test read and said she also needs a recent Hep B vaccine so she wants to have it done at the same time. Pt is aware that her flu shot cannot be done since it's not flu season so she will get it done this fall. Please place order for Hep B

## 2022-07-08 NOTE — Addendum Note (Signed)
Addended by: Abbe Amsterdam C on: 07/08/2022 12:16 PM   Modules accepted: Orders

## 2022-07-08 NOTE — Telephone Encounter (Signed)
Hep B dates are: 05/03/00, 12/11/99, Sep 30, 1999- does pt qualify for titer?

## 2022-07-09 ENCOUNTER — Other Ambulatory Visit: Payer: BC Managed Care – PPO

## 2022-07-09 ENCOUNTER — Ambulatory Visit (INDEPENDENT_AMBULATORY_CARE_PROVIDER_SITE_OTHER): Payer: BC Managed Care – PPO | Admitting: *Deleted

## 2022-07-09 DIAGNOSIS — Z111 Encounter for screening for respiratory tuberculosis: Secondary | ICD-10-CM

## 2022-07-09 DIAGNOSIS — Z7721 Contact with and (suspected) exposure to potentially hazardous body fluids: Secondary | ICD-10-CM | POA: Diagnosis not present

## 2022-07-09 LAB — TB SKIN TEST
Induration: 0 mm
TB Skin Test: NEGATIVE

## 2022-07-09 NOTE — Progress Notes (Addendum)
Patient came in for tb read.  Tb negative at 0mm induration.

## 2022-07-09 NOTE — Addendum Note (Signed)
Addended by: Mervin Kung A on: 07/09/2022 11:55 AM   Modules accepted: Orders

## 2022-07-10 ENCOUNTER — Encounter: Payer: Self-pay | Admitting: Family Medicine

## 2022-07-10 LAB — HEPATITIS B SURFACE ANTIBODY, QUANTITATIVE: Hep B S AB Quant (Post): 49 m[IU]/mL (ref >=10)

## 2022-07-13 DIAGNOSIS — K30 Functional dyspepsia: Secondary | ICD-10-CM | POA: Diagnosis not present

## 2022-07-13 DIAGNOSIS — K219 Gastro-esophageal reflux disease without esophagitis: Secondary | ICD-10-CM | POA: Diagnosis not present

## 2022-07-19 DIAGNOSIS — M6289 Other specified disorders of muscle: Secondary | ICD-10-CM | POA: Diagnosis not present

## 2022-07-19 DIAGNOSIS — R102 Pelvic and perineal pain: Secondary | ICD-10-CM | POA: Diagnosis not present

## 2022-07-19 DIAGNOSIS — N3010 Interstitial cystitis (chronic) without hematuria: Secondary | ICD-10-CM | POA: Diagnosis not present

## 2022-07-20 ENCOUNTER — Other Ambulatory Visit: Payer: Self-pay

## 2022-07-20 ENCOUNTER — Telehealth: Payer: Self-pay | Admitting: Family Medicine

## 2022-07-20 DIAGNOSIS — Z111 Encounter for screening for respiratory tuberculosis: Secondary | ICD-10-CM

## 2022-07-20 NOTE — Telephone Encounter (Signed)
Pt sent message on 07/16/22 advising that her school requires two separate TB skin tests done no more than 3 weeks apart. Pt needs a second TB skin test done in the next week or so. Please place order and call to advise when able to schedule.

## 2022-07-20 NOTE — Telephone Encounter (Signed)
FYI: Mychart note has been sent to the pt to schedule and order has been placed.

## 2022-07-22 DIAGNOSIS — R102 Pelvic and perineal pain: Secondary | ICD-10-CM | POA: Diagnosis not present

## 2022-07-22 DIAGNOSIS — M6289 Other specified disorders of muscle: Secondary | ICD-10-CM | POA: Diagnosis not present

## 2022-07-22 DIAGNOSIS — G8929 Other chronic pain: Secondary | ICD-10-CM | POA: Diagnosis not present

## 2022-07-22 DIAGNOSIS — N3281 Overactive bladder: Secondary | ICD-10-CM | POA: Diagnosis not present

## 2022-07-26 NOTE — Progress Notes (Unsigned)
PPD Placement note Sandra Richmond, 23 y.o. female is here today for placement of PPD test Reason for PPD test: school, pt going to school for occupational therapy Pt taken PPD test before: yes Verified in allergy area and with patient that they are not allergic to the products PPD is made of (Phenol or Tween). Yes Is patient taking any oral or IV steroid medication now or have they taken it in the last month? no Has the patient ever received the BCG vaccine?: no Has the patient been in recent contact with anyone known or suspected of having active TB disease?: no O: Alert and oriented in NAD. P:  PPD placed on 07/27/22.  Patient advised to return for reading within 48-72 hours.

## 2022-07-27 ENCOUNTER — Ambulatory Visit (INDEPENDENT_AMBULATORY_CARE_PROVIDER_SITE_OTHER): Payer: BC Managed Care – PPO | Admitting: *Deleted

## 2022-07-27 DIAGNOSIS — N3010 Interstitial cystitis (chronic) without hematuria: Secondary | ICD-10-CM | POA: Diagnosis not present

## 2022-07-27 DIAGNOSIS — Z111 Encounter for screening for respiratory tuberculosis: Secondary | ICD-10-CM | POA: Diagnosis not present

## 2022-07-28 ENCOUNTER — Ambulatory Visit: Payer: BC Managed Care – PPO

## 2022-07-29 ENCOUNTER — Encounter: Payer: Self-pay | Admitting: Family Medicine

## 2022-07-29 ENCOUNTER — Ambulatory Visit (INDEPENDENT_AMBULATORY_CARE_PROVIDER_SITE_OTHER): Payer: BC Managed Care – PPO | Admitting: *Deleted

## 2022-07-29 DIAGNOSIS — Z111 Encounter for screening for respiratory tuberculosis: Secondary | ICD-10-CM

## 2022-07-29 LAB — TB SKIN TEST
Induration: 0 mm
TB Skin Test: NEGATIVE

## 2022-07-29 NOTE — Progress Notes (Signed)
Patient here for tb read for school.   Tb read negative.

## 2022-08-02 DIAGNOSIS — N946 Dysmenorrhea, unspecified: Secondary | ICD-10-CM | POA: Diagnosis not present

## 2022-08-02 DIAGNOSIS — N941 Unspecified dyspareunia: Secondary | ICD-10-CM | POA: Diagnosis not present

## 2022-08-02 DIAGNOSIS — G8929 Other chronic pain: Secondary | ICD-10-CM | POA: Diagnosis not present

## 2022-08-02 DIAGNOSIS — R3989 Other symptoms and signs involving the genitourinary system: Secondary | ICD-10-CM | POA: Diagnosis not present

## 2022-08-02 DIAGNOSIS — R102 Pelvic and perineal pain: Secondary | ICD-10-CM | POA: Diagnosis not present

## 2022-08-17 DIAGNOSIS — D2262 Melanocytic nevi of left upper limb, including shoulder: Secondary | ICD-10-CM | POA: Diagnosis not present

## 2022-08-17 DIAGNOSIS — D2230 Melanocytic nevi of unspecified part of face: Secondary | ICD-10-CM | POA: Diagnosis not present

## 2022-08-17 DIAGNOSIS — D2261 Melanocytic nevi of right upper limb, including shoulder: Secondary | ICD-10-CM | POA: Diagnosis not present

## 2022-08-17 DIAGNOSIS — D225 Melanocytic nevi of trunk: Secondary | ICD-10-CM | POA: Diagnosis not present

## 2022-08-24 DIAGNOSIS — G8929 Other chronic pain: Secondary | ICD-10-CM | POA: Diagnosis not present

## 2022-08-24 DIAGNOSIS — R102 Pelvic and perineal pain: Secondary | ICD-10-CM | POA: Diagnosis not present

## 2022-08-24 DIAGNOSIS — M6289 Other specified disorders of muscle: Secondary | ICD-10-CM | POA: Diagnosis not present

## 2022-08-24 DIAGNOSIS — N3281 Overactive bladder: Secondary | ICD-10-CM | POA: Diagnosis not present

## 2022-08-26 DIAGNOSIS — R300 Dysuria: Secondary | ICD-10-CM | POA: Diagnosis not present

## 2022-08-26 DIAGNOSIS — Z7251 High risk heterosexual behavior: Secondary | ICD-10-CM | POA: Diagnosis not present

## 2022-08-26 DIAGNOSIS — N3281 Overactive bladder: Secondary | ICD-10-CM | POA: Diagnosis not present

## 2022-08-26 DIAGNOSIS — Z3202 Encounter for pregnancy test, result negative: Secondary | ICD-10-CM | POA: Diagnosis not present

## 2022-11-08 IMAGING — DX DG SCAPULA*R*
2 series · 2 of 2 positions shown · non-contrast
Comparison: None.

CLINICAL DATA: Acute right-sided thoracic back pain and posterior
shoulder/scapular pain.

EXAM:
RIGHT SCAPULA - 2+ VIEWS

[scapula ap]
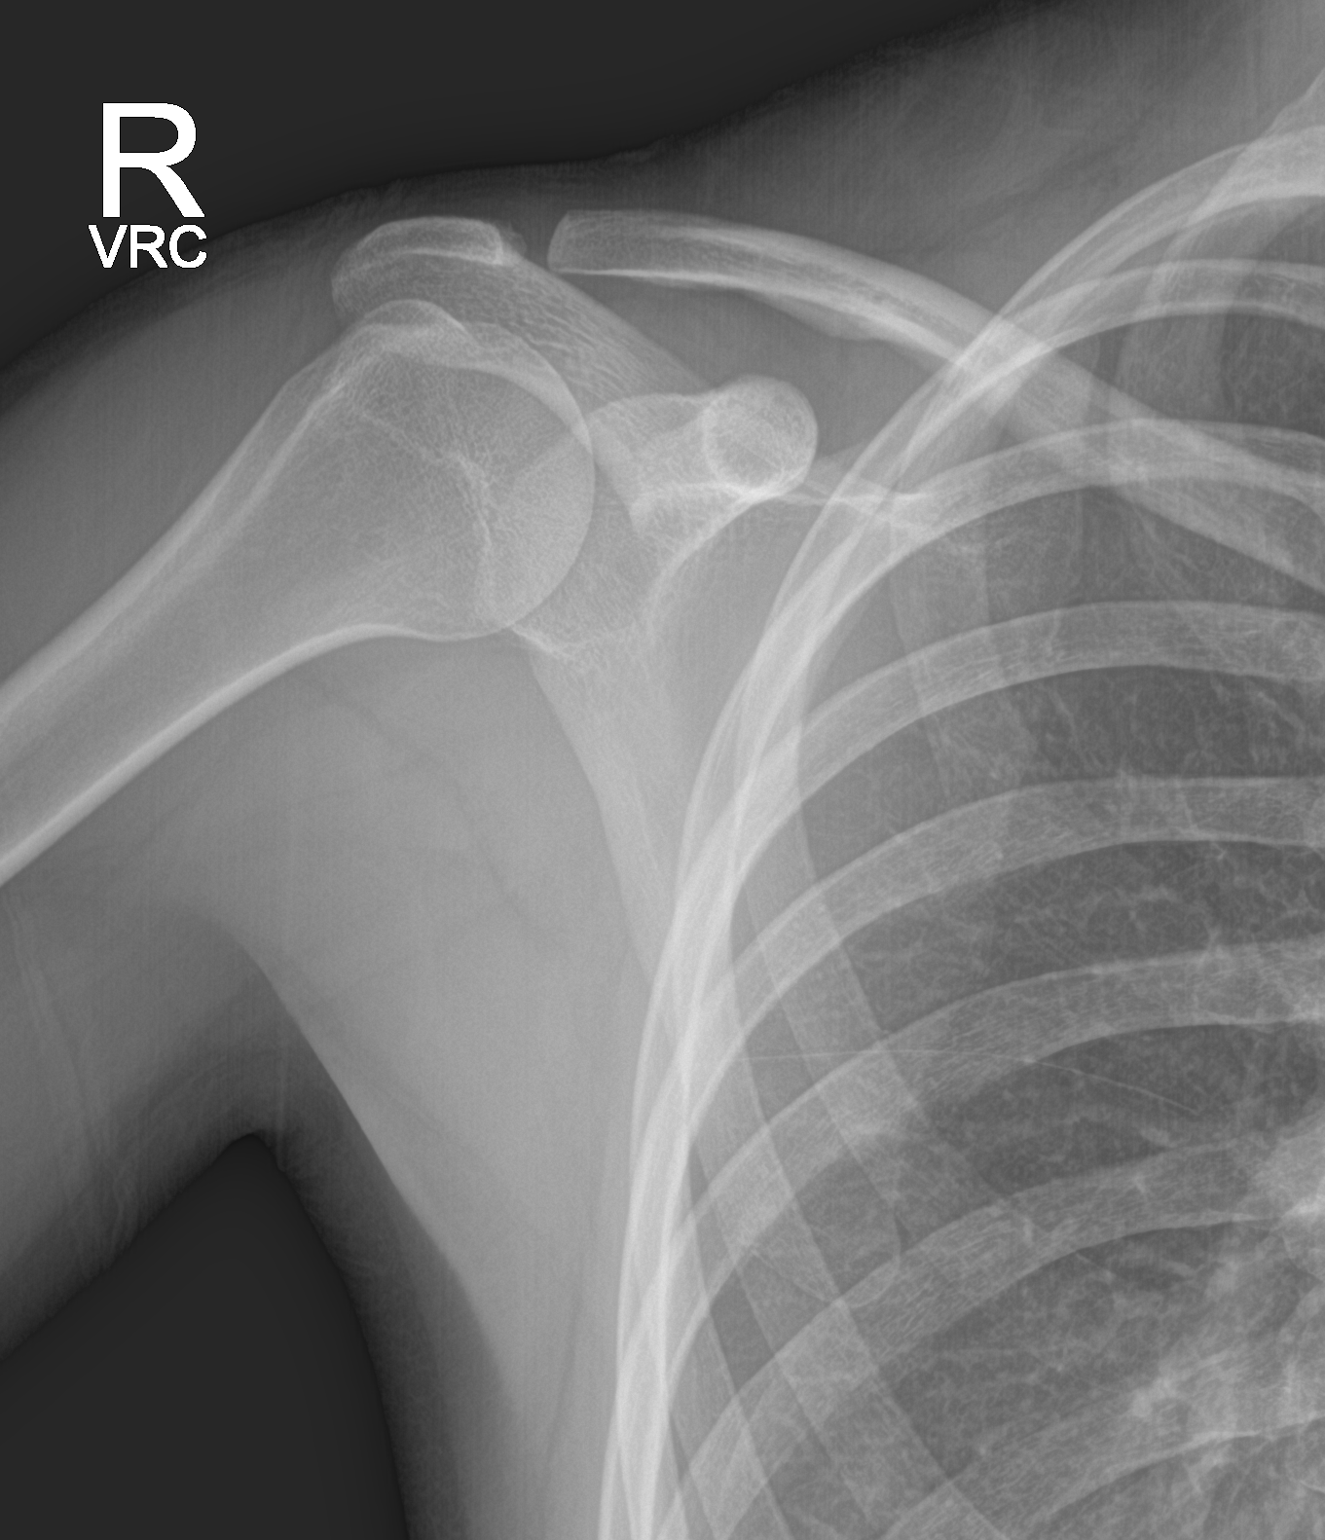

[scapula lat]
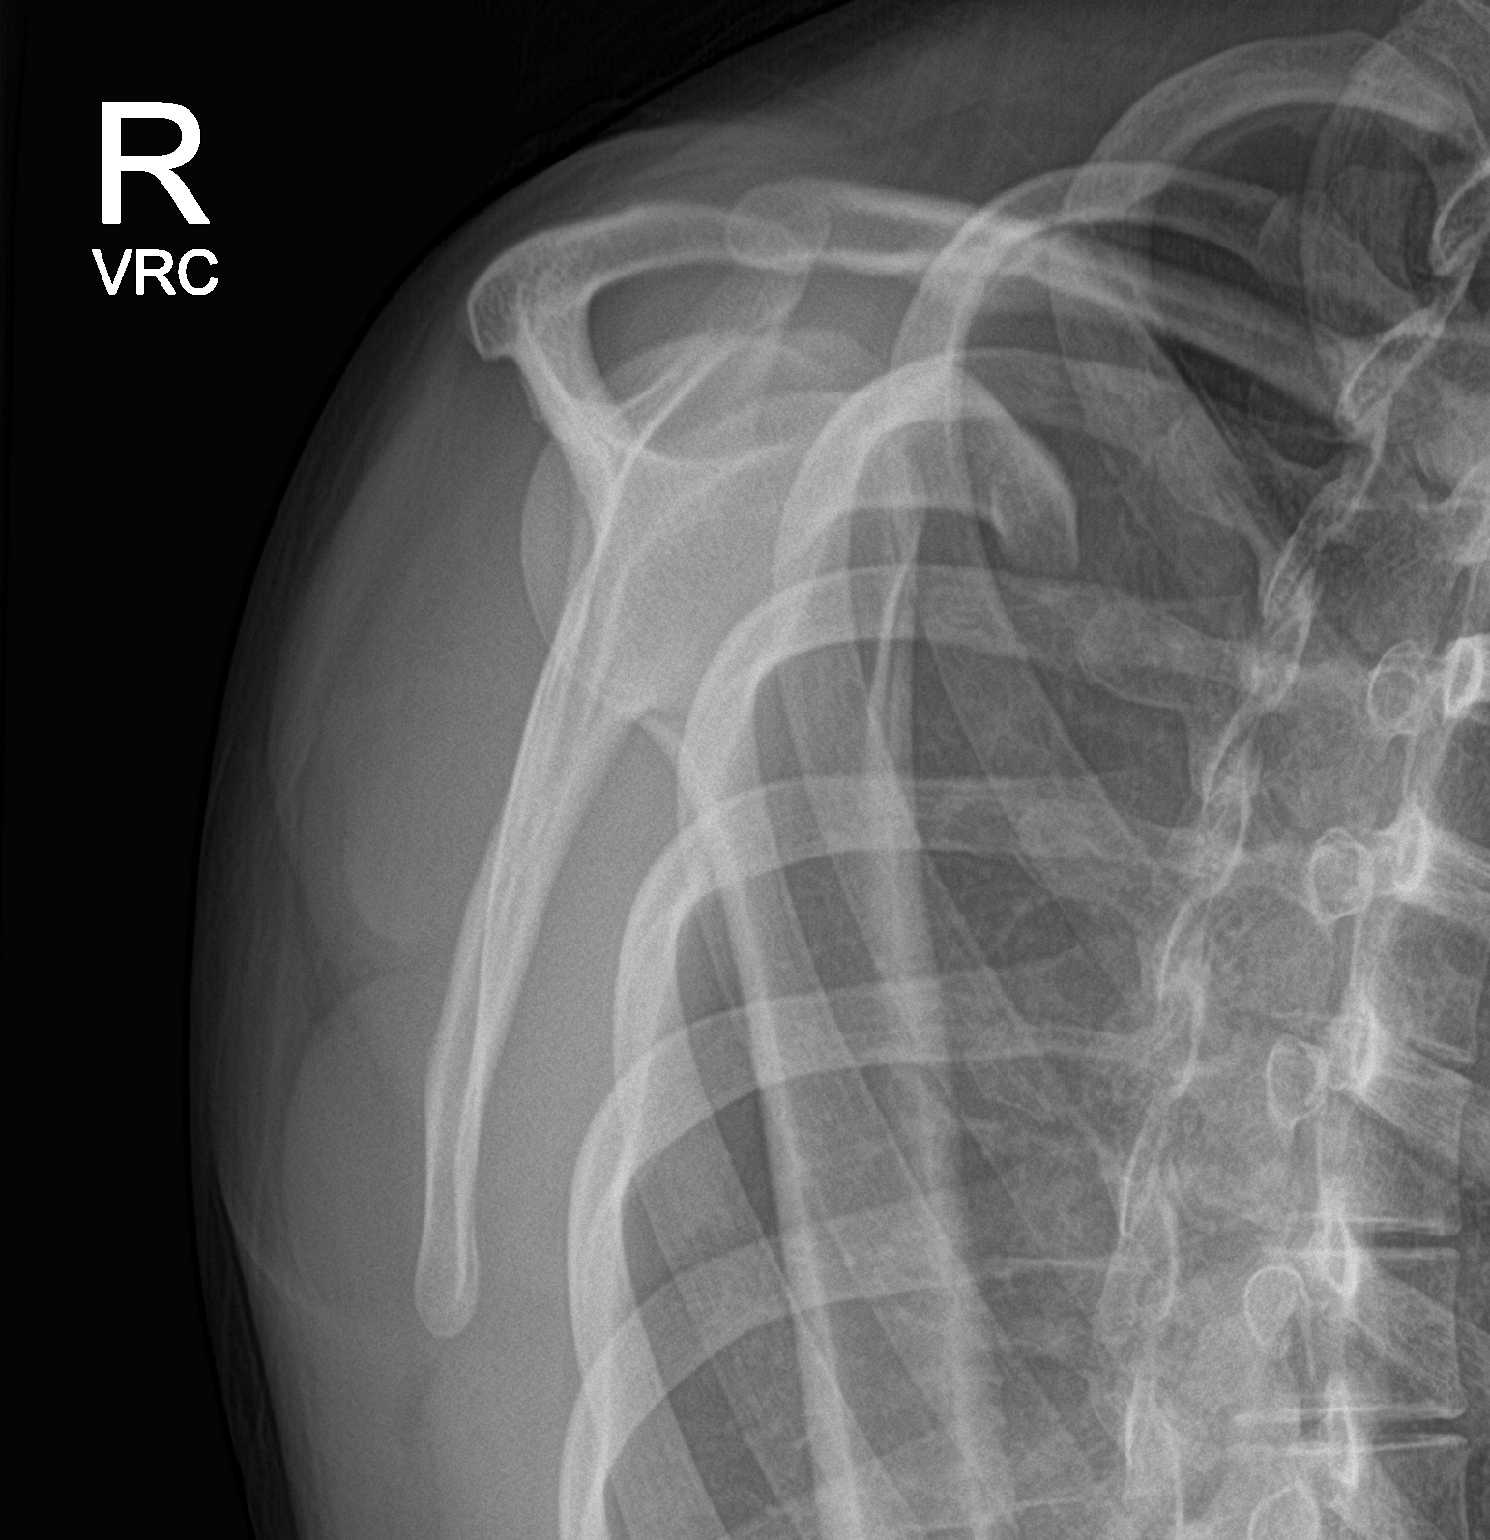

[2 of 2 positions shown; findings below may reference images not displayed]

FINDINGS: There is no evidence of fracture or other focal bone lesions. Soft
tissues are unremarkable.
IMPRESSION: Negative.

## 2022-11-08 IMAGING — DX DG THORACIC SPINE 2V
2 series · 2 of 2 positions shown · non-contrast
Comparison: None.

CLINICAL DATA: Acute right-sided thoracic back pain and posterior
shoulder pain.

EXAM:
THORACIC SPINE 2 VIEWS

[t-spine ap]
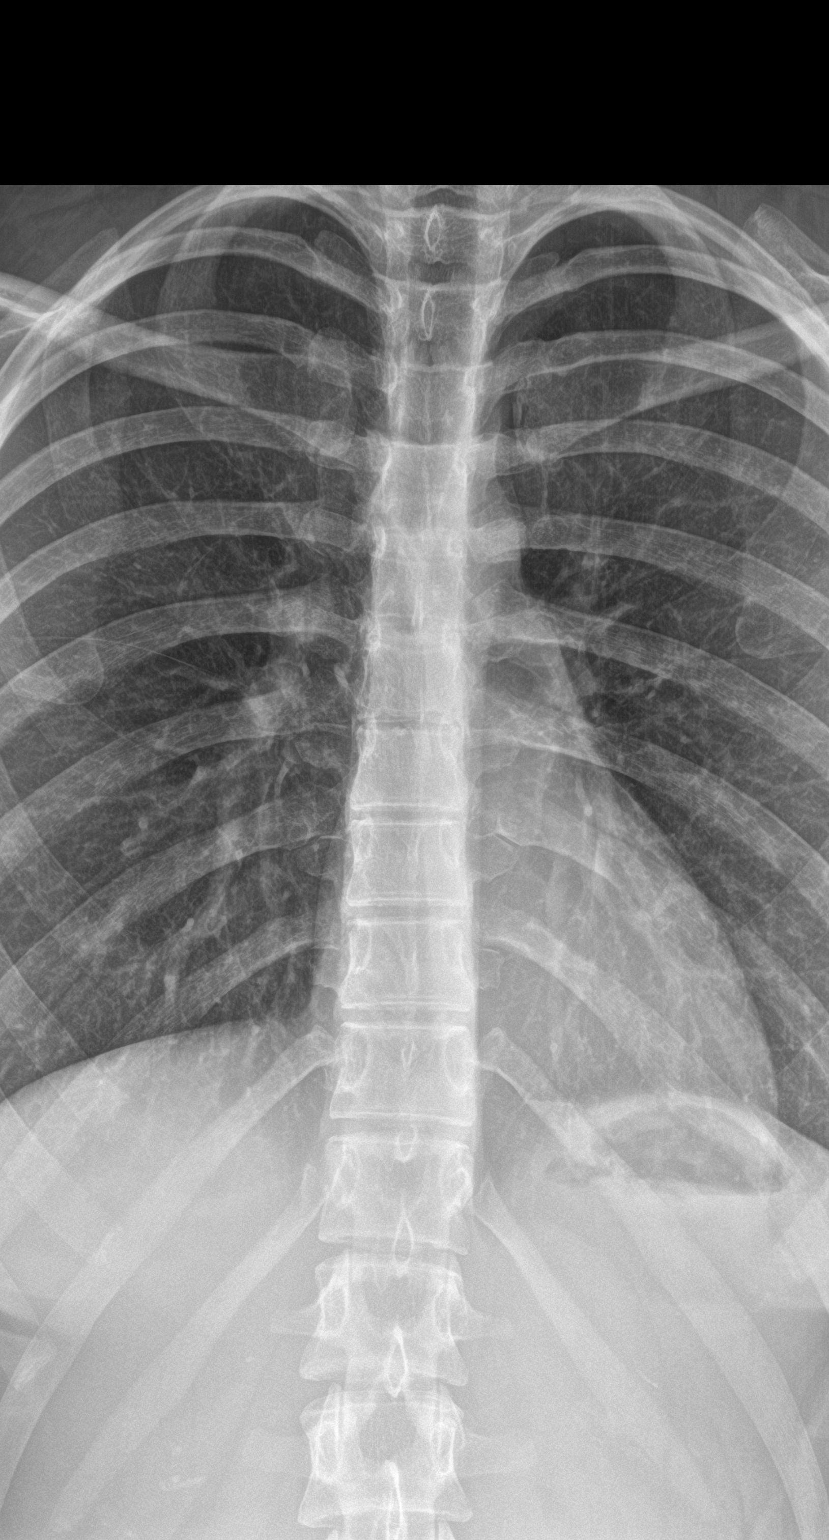

[t-spine lat]
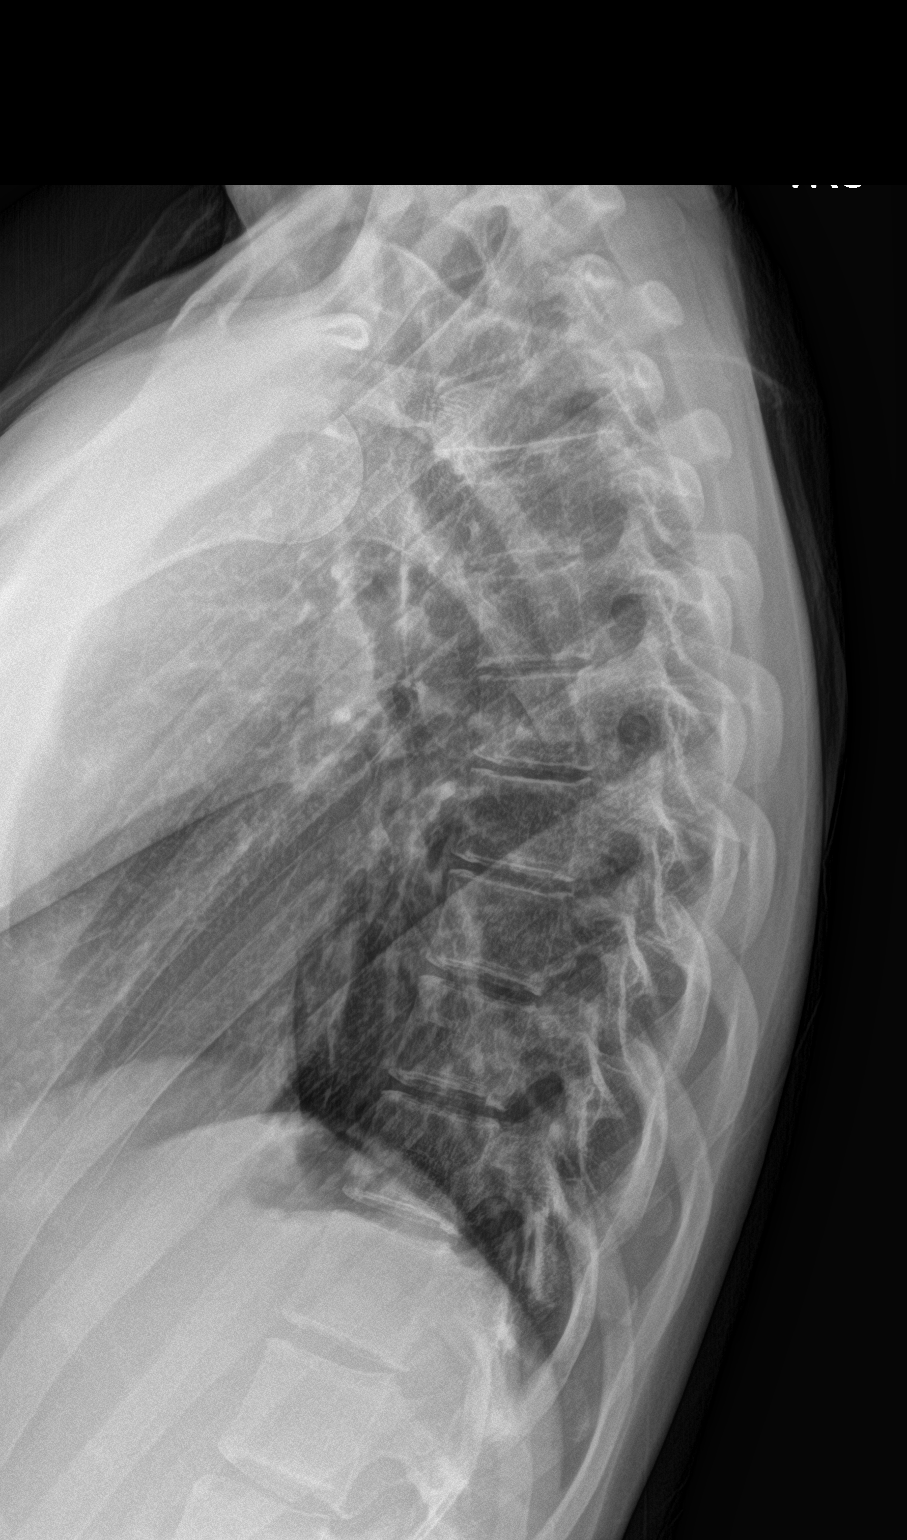

[2 of 2 positions shown; findings below may reference images not displayed]

FINDINGS: There are 12 pairs of ribs. Vertebral alignment is normal. No
fracture is identified. Intervertebral disc space heights are
preserved. The visualized portions of the lungs are clear.
IMPRESSION: Negative.

## 2022-11-16 DIAGNOSIS — N809 Endometriosis, unspecified: Secondary | ICD-10-CM | POA: Diagnosis not present

## 2022-11-16 DIAGNOSIS — M6289 Other specified disorders of muscle: Secondary | ICD-10-CM | POA: Diagnosis not present

## 2022-11-16 DIAGNOSIS — N3010 Interstitial cystitis (chronic) without hematuria: Secondary | ICD-10-CM | POA: Diagnosis not present

## 2022-11-16 DIAGNOSIS — R102 Pelvic and perineal pain: Secondary | ICD-10-CM | POA: Diagnosis not present

## 2022-12-03 DIAGNOSIS — R102 Pelvic and perineal pain: Secondary | ICD-10-CM | POA: Diagnosis not present

## 2022-12-03 DIAGNOSIS — M6281 Muscle weakness (generalized): Secondary | ICD-10-CM | POA: Diagnosis not present

## 2022-12-03 DIAGNOSIS — G8929 Other chronic pain: Secondary | ICD-10-CM | POA: Diagnosis not present

## 2022-12-03 DIAGNOSIS — N3281 Overactive bladder: Secondary | ICD-10-CM | POA: Diagnosis not present

## 2022-12-30 NOTE — Progress Notes (Signed)
Sandra Richmond D.[facility] [address] [place] [address] Phone: [phone]   Assessment and Plan:     1. De Quervain's tenosynovitis, left -Acute, initial sports medicine visit - 1 month of thumb strain and numbness over radial side of thumb without specific MOI.  Suspect DeQuervain's Tenosynovitis based on HPI and physical exam.  This may have led to inflammation and irritation of localized small neurologic structures - Start meloxicam 15 mg daily x2 weeks.  If still having pain after 2 weeks, complete 3rd-week of NSAID. May use remaining NSAID as needed once daily for pain control.  Do not to use additional over-the-counter NSAIDs (ibuprofen, naproxen, Advil, Aleve) while taking prescription NSAIDs.  May use Tylenol [phone] mg 2 to 3 times a day for breakthrough pain. - Start HEP for DeQuervain's Tenosynovitis  2.  Paresthesias - Patient complains of intermittent numbness and tingling sensations that will occur on different areas of her body including face, shoulder, arms, legs.  This can occur at rest or when exercising. - Recommend following up with PCP to discuss causes of intermittent paresthesias.  PCP may recommend lab work to include electrolytes, vitamin D, vitamin B12, other.  I will allow PCP to dictate further care - We discussed that I do not have a musculoskeletal/sports medicine cause for sporadic numbness and tingling at different areas of the body with or without physical activity  Pertinent previous records reviewed include none  Follow Up: As needed if no improvement or worsening of symptoms 3 to 4 weeks.  Could obtain hand x-ray and discuss bracing versus CSI   Subjective:   I, Moenique Parris, am serving as a Neurosurgeon for Doctor Richardean Sale   Chief Complaint: thoracic back pain    HPI:  04/13/2021 Patient is a 22 year old female complaining of thoracic pain. Patient states she hurt herself lifting weights but it  seems like a gradual overuse injury, not a sudden accident. She notes pain/ discomfort in her right upper back between the spine and the scapula for about 4 weeks It is very tender to touch it hurts when she moves her arms around It hurts to hiccup - she has not coughed that she can recall. She notes that she "almost passed out" when her derm was touching her to take a picture of a mole on this area, has been icing and has had an xray done is asking not to be touched , does get numbness and tingling sometimes when she is  weightlifting and sometimes just when she stands up.  Posterior right pain is not associated with eating and patient does not have right upper quadrant pain.   05/22/2021 Patient states that she is better than when she came the first time she still feels a little sprain its not as bad as when she came in the meloxicam really helped, wants to know if still feeling the little pain is normal and how to fix it    08/12/2021 Patient states that she is good but wants to have her back checked out again still feels a strain , did a lunge and heard a pop in her low back isnt able to sit or lay down with out pain, her right elbow locks with any weighted movement    12/31/2022 Patient states left thumb is numb and tingling. Numb and tingling for a month. 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 States if she uses her thumb to  much its a cramping sensation. States it feels like a strain    Relevant Historical Information: On birth control  Additional pertinent review of systems negative.   Current Outpatient Medications:    levonorgestrel-ethinyl estradiol (VIENVA) 0.1-20 MG-MCG tablet, Take 1 tablet by mouth daily., Disp: 168 tablet, Rfl: 1   meloxicam (MOBIC) 15 MG tablet, Take 1 tablet (15 mg total) by mouth daily., Disp: 30 tablet, Rfl: 0   pantoprazole (PROTONIX) 40 MG tablet, Take 1 tablet (40 mg total) by mouth daily., Disp: 30 tablet, Rfl: 0   sucralfate  (CARAFATE) 1 g tablet, Take 1 tablet (1 g total) by mouth 4 (four) times daily -  with meals and at bedtime., Disp: 120 tablet, Rfl: 0   Objective:     Vitals:   12/31/22 1132  Pulse: 67  SpO2: 99%  Weight: 130 lb (59 kg)  Height: 5\' 7"  (1.702 m)      Body mass index is 20.36 kg/m.    Physical Exam:    General: Appears well, nad, nontoxic and pleasant Neuro:sensation intact, strength is 5/5 in upper extremities, muscle tone wnl Skin:no susupicious lesions or rashes  Left wrist:   No deformity or swelling appreciated. ROM  Ext 90, flexion70, radial/ulnar deviation 30 nttp over the snuff box, dorsal carpals, volar carpals, radial styloid, ulnar styloid, 1st mcp, tfcc Negative Tinel's Positive finklestein Neg tfcc bounce test No pain with resisted ext, flex or deviation    Electronically signed by:  Sandra Richmond D.[facility] 12:14 PM 12/31/22

## 2022-12-31 ENCOUNTER — Ambulatory Visit (INDEPENDENT_AMBULATORY_CARE_PROVIDER_SITE_OTHER): Payer: BC Managed Care – PPO | Admitting: Sports Medicine

## 2022-12-31 VITALS — HR 67 | Ht 67.0 in | Wt 130.0 lb

## 2022-12-31 DIAGNOSIS — M654 Radial styloid tenosynovitis [de Quervain]: Secondary | ICD-10-CM | POA: Diagnosis not present

## 2022-12-31 DIAGNOSIS — R202 Paresthesia of skin: Secondary | ICD-10-CM

## 2022-12-31 MED ORDER — MELOXICAM 15 MG PO TABS: 15.0000 mg | ORAL_TABLET | Freq: Every day | ORAL | 0 refills | Status: AC

## 2022-12-31 NOTE — Patient Instructions (Signed)
-   Start meloxicam 15 mg daily x2 weeks.  If still having pain after 2 weeks, complete 3rd-week of NSAID. May use remaining NSAID as needed once daily for pain control.  Do not to use additional over-the-counter NSAIDs (ibuprofen, naproxen, Advil, Aleve) while taking prescription NSAIDs.  May use Tylenol [phone] mg 2 to 3 times a day for breakthrough pain. Elbow HEP  Recommend discussing tingling sensation with PCP  As needed follow up ,if no improvement 4 week follow up

## 2023-01-16 DIAGNOSIS — B351 Tinea unguium: Secondary | ICD-10-CM | POA: Diagnosis not present

## 2023-01-16 DIAGNOSIS — N3281 Overactive bladder: Secondary | ICD-10-CM | POA: Diagnosis not present

## 2023-01-27 DIAGNOSIS — R102 Pelvic and perineal pain: Secondary | ICD-10-CM | POA: Diagnosis not present

## 2023-01-27 DIAGNOSIS — M6289 Other specified disorders of muscle: Secondary | ICD-10-CM | POA: Diagnosis not present

## 2023-01-27 DIAGNOSIS — N3281 Overactive bladder: Secondary | ICD-10-CM | POA: Diagnosis not present

## 2023-01-27 DIAGNOSIS — K5909 Other constipation: Secondary | ICD-10-CM | POA: Diagnosis not present

## 2023-01-31 ENCOUNTER — Other Ambulatory Visit: Payer: Self-pay | Admitting: Family Medicine

## 2023-01-31 DIAGNOSIS — N920 Excessive and frequent menstruation with regular cycle: Secondary | ICD-10-CM

## 2023-02-07 ENCOUNTER — Other Ambulatory Visit: Payer: Self-pay | Admitting: Sports Medicine

## 2023-02-24 DIAGNOSIS — R300 Dysuria: Secondary | ICD-10-CM | POA: Diagnosis not present

## 2023-02-24 DIAGNOSIS — R102 Pelvic and perineal pain: Secondary | ICD-10-CM | POA: Diagnosis not present

## 2023-03-11 DIAGNOSIS — R102 Pelvic and perineal pain: Secondary | ICD-10-CM | POA: Diagnosis not present

## 2023-03-18 DIAGNOSIS — K219 Gastro-esophageal reflux disease without esophagitis: Secondary | ICD-10-CM | POA: Diagnosis not present

## 2023-03-18 DIAGNOSIS — R140 Abdominal distension (gaseous): Secondary | ICD-10-CM | POA: Diagnosis not present

## 2023-03-18 DIAGNOSIS — K30 Functional dyspepsia: Secondary | ICD-10-CM | POA: Diagnosis not present

## 2023-03-22 DIAGNOSIS — D2262 Melanocytic nevi of left upper limb, including shoulder: Secondary | ICD-10-CM | POA: Diagnosis not present

## 2023-03-27 DIAGNOSIS — J029 Acute pharyngitis, unspecified: Secondary | ICD-10-CM | POA: Diagnosis not present

## 2023-03-31 DIAGNOSIS — J0100 Acute maxillary sinusitis, unspecified: Secondary | ICD-10-CM | POA: Diagnosis not present

## 2023-04-08 DIAGNOSIS — Z1330 Encounter for screening examination for mental health and behavioral disorders, unspecified: Secondary | ICD-10-CM | POA: Diagnosis not present

## 2023-04-08 DIAGNOSIS — N946 Dysmenorrhea, unspecified: Secondary | ICD-10-CM | POA: Diagnosis not present

## 2023-04-08 DIAGNOSIS — R3989 Other symptoms and signs involving the genitourinary system: Secondary | ICD-10-CM | POA: Diagnosis not present

## 2023-04-08 DIAGNOSIS — N941 Unspecified dyspareunia: Secondary | ICD-10-CM | POA: Diagnosis not present

## 2023-04-15 ENCOUNTER — Other Ambulatory Visit: Payer: Self-pay | Admitting: Family Medicine

## 2023-04-15 DIAGNOSIS — N920 Excessive and frequent menstruation with regular cycle: Secondary | ICD-10-CM

## 2023-04-15 MED ORDER — LEVONORGESTREL-ETHINYL ESTRAD 0.1-20 MG-MCG PO TABS: 1.0000 | ORAL_TABLET | Freq: Every day | ORAL | 0 refills | Status: DC

## 2023-04-15 NOTE — Telephone Encounter (Signed)
 levonorgestrel-ethinyl estradiol (VIENVA) 0.1-20 MG-MCG table   Pt sees GYN- do you manage this Rx?

## 2023-04-15 NOTE — Telephone Encounter (Signed)
 Copied from CRM [id number]. Topic: Clinical - Medication Refill >> Apr 15, 2023  8:58 AM Orinda Kenner C wrote: Most Recent Primary Care Visit:  Provider: Thelma Barge D  Department: [facility]  Visit Type: NURSE VISIT  Date: 07/29/2022  Medication: levonorgestrel-ethinyl estradiol (VIENVA) 0.1-20 MG-MCG table  Has the patient contacted their pharmacy? Yes (Agent: If no, request that the patient contact the pharmacy for the refill. If patient does not wish to contact the pharmacy document the reason why and proceed with request.) (Agent: If yes, when and what did the pharmacy advise?)  Is this the correct pharmacy for this prescription? Yes If no, delete pharmacy and type the correct one.  This is the patient's preferred pharmacy:  [id number] - [place], [place] - [address] [place] [address] Phone: [phone] Fax: [phone]  Has the prescription been filled recently? No  Is the patient out of the medication? No. Patient [phone] has one more pack and does not have any more refills for levonorgestrel-ethinyl estradiol (VIENVA) 0.1-20 MG-MCG tablet. Patient asked if she can do a video visit, Per [facility] an in office. Patient can only can be seen 04/29/23 due to being in graduated school, the schedules is hard to take off. Patient is asking for a call back today, so she can see what she can do to come in for an office visit. Please advise.    Has the patient been seen for an appointment in the last year OR does the patient have an upcoming appointment? No  Can we respond through MyChart? No. Please call back at [phone]  Agent: Please be advised that Rx refills may take up to 3 business days. We ask that you follow-up with your pharmacy.

## 2023-04-15 NOTE — Telephone Encounter (Signed)
Correct pharmacy verified

## 2023-04-22 DIAGNOSIS — J36 Peritonsillar abscess: Secondary | ICD-10-CM | POA: Diagnosis not present

## 2023-04-23 DIAGNOSIS — Y929 Unspecified place or not applicable: Secondary | ICD-10-CM | POA: Diagnosis not present

## 2023-04-23 DIAGNOSIS — T7840XA Allergy, unspecified, initial encounter: Secondary | ICD-10-CM | POA: Diagnosis not present

## 2023-04-23 DIAGNOSIS — J029 Acute pharyngitis, unspecified: Secondary | ICD-10-CM | POA: Diagnosis not present

## 2023-04-23 DIAGNOSIS — Y939 Activity, unspecified: Secondary | ICD-10-CM | POA: Diagnosis not present

## 2023-04-23 DIAGNOSIS — X58XXXA Exposure to other specified factors, initial encounter: Secondary | ICD-10-CM | POA: Diagnosis not present

## 2023-04-23 DIAGNOSIS — Y999 Unspecified external cause status: Secondary | ICD-10-CM | POA: Diagnosis not present

## 2023-04-25 DIAGNOSIS — K219 Gastro-esophageal reflux disease without esophagitis: Secondary | ICD-10-CM | POA: Diagnosis not present

## 2023-04-25 DIAGNOSIS — K209 Esophagitis, unspecified without bleeding: Secondary | ICD-10-CM | POA: Diagnosis not present

## 2023-04-26 DIAGNOSIS — J029 Acute pharyngitis, unspecified: Secondary | ICD-10-CM | POA: Diagnosis not present

## 2023-04-26 DIAGNOSIS — Z881 Allergy status to other antibiotic agents status: Secondary | ICD-10-CM | POA: Diagnosis not present

## 2023-04-26 DIAGNOSIS — R202 Paresthesia of skin: Secondary | ICD-10-CM | POA: Diagnosis not present

## 2023-04-26 DIAGNOSIS — Z7952 Long term (current) use of systemic steroids: Secondary | ICD-10-CM | POA: Diagnosis not present

## 2023-04-26 DIAGNOSIS — R21 Rash and other nonspecific skin eruption: Secondary | ICD-10-CM | POA: Diagnosis not present

## 2023-04-28 DIAGNOSIS — K219 Gastro-esophageal reflux disease without esophagitis: Secondary | ICD-10-CM | POA: Diagnosis not present

## 2023-04-28 DIAGNOSIS — R1310 Dysphagia, unspecified: Secondary | ICD-10-CM | POA: Diagnosis not present

## 2023-04-28 DIAGNOSIS — K30 Functional dyspepsia: Secondary | ICD-10-CM | POA: Diagnosis not present

## 2023-04-29 ENCOUNTER — Ambulatory Visit (INDEPENDENT_AMBULATORY_CARE_PROVIDER_SITE_OTHER): Admitting: Physician Assistant

## 2023-04-29 ENCOUNTER — Encounter: Payer: Self-pay | Admitting: Physician Assistant

## 2023-04-29 VITALS — BP 115/73 | HR 98 | Ht 67.0 in | Wt 134.2 lb

## 2023-04-29 DIAGNOSIS — Q831 Accessory breast: Secondary | ICD-10-CM | POA: Diagnosis not present

## 2023-04-29 DIAGNOSIS — N920 Excessive and frequent menstruation with regular cycle: Secondary | ICD-10-CM

## 2023-04-29 MED ORDER — LEVONORGESTREL-ETHINYL ESTRAD 0.1-20 MG-MCG PO TABS: 1.0000 | ORAL_TABLET | Freq: Every day | ORAL | 2 refills | Status: AC

## 2023-04-29 NOTE — Progress Notes (Signed)
 Established patient visit   Patient: Sandra Richmond   DOB: [birth year]   22 y.o. Female  MRN: [id number] Visit Date: 04/29/2023  Today's healthcare provider: Alfredia Ferguson, PA-C   Cc. Ocp refill, discussion of current gerd/throat symptoms  Subjective     Pt presents for a refill on her birth control.  She also has several questions: -she has extra tissue under b/l armpits, seems to be getting bigger. Symmetrical, non tender no masses.  -She is being w/u for GERD, allergies, throat swelling and has upcoming allergy testing and an endoscopy. She has questions to if anything is missing from this w/u of her symptoms.  Symptoms originally started-- 2 mo ago URI, and then another URI, with the last URI ~2 weeks ago, reactions to antibiotic and prednisone that sent her to the ER. Since then, throat swelling feeling, difficulty swallowing, feeling something stuck in her throat. Reports URI testing was negative--strep, covid, flu.  Medications: Outpatient Medications Prior to Visit  Medication Sig   amitriptyline (ELAVIL) 25 MG tablet Take 25 mg by mouth at bedtime.   lansoprazole (PREVACID) 30 MG capsule Take 30 mg by mouth daily.   meloxicam (MOBIC) 15 MG tablet Take 1 tablet (15 mg total) by mouth daily.   [DISCONTINUED] levonorgestrel-ethinyl estradiol (VIENVA) 0.1-20 MG-MCG tablet Take 1 tablet by mouth daily.   [DISCONTINUED] pantoprazole (PROTONIX) 40 MG tablet Take 1 tablet (40 mg total) by mouth daily.   [DISCONTINUED] sucralfate (CARAFATE) 1 g tablet Take 1 tablet (1 g total) by mouth 4 (four) times daily -  with meals and at bedtime.   No facility-administered medications prior to visit.    Review of Systems  Constitutional:  Negative for fatigue and fever.  HENT:  Positive for sneezing and trouble swallowing.   Respiratory:  Negative for cough and shortness of breath.   Cardiovascular:  Negative for chest pain and leg swelling.  Gastrointestinal:  Negative for abdominal  pain.  Neurological:  Negative for dizziness and headaches.       Objective    BP 115/73   Pulse 98   Ht 5\' 7"  (1.702 m)   Wt 134 lb 3.2 oz (60.9 kg)   LMP 04/04/2023 (Approximate)   BMI 21.02 kg/m    Physical Exam Vitals reviewed.  Constitutional:      Appearance: She is not ill-appearing.  HENT:     Head: Normocephalic.  Eyes:     Conjunctiva/sclera: Conjunctivae normal.  Cardiovascular:     Rate and Rhythm: Normal rate.  Pulmonary:     Effort: Pulmonary effort is normal. No respiratory distress.  Chest:     Comments: B/l prominence of axillary breast tissue w/ no masses, skin thickening. No axillary lymphadenopathy  Neurological:     Mental Status: She is alert and oriented to person, place, and time.  Psychiatric:        Mood and Affect: Mood normal.        Behavior: Behavior normal.     No results found for any visits on 04/29/23.  Assessment & Plan    Menorrhagia with regular cycle -     Levonorgestrel-Ethinyl Estrad; Take 1 tablet by mouth daily.  Dispense: 84 tablet; Refill: 2  Axillary accessory breast tissue  Advised axillary breast tissue is normal, can grow with any weight changes. If she appreciates any masses/asymmetric changes, to f/b in office.  Refilled OCP. -- advised pt to schedule a cpe this year.  Based on patient's history and  upcoming plan-- agree with planned endoscopy and allergy testing, reviewed specialist notes.  Advised at this point a throat swab would likely not reveal anything, given tx of multiple abx.  Could consider mono testing for original illness- but we would not treat differently.   For now, f/u as planned.   Return in about 3 months (around 07/30/2023), or if symptoms worsen or fail to improve, for CPE.      Alfredia Ferguson, PA-C  [facility] at [facility] [phone] (phone) [phone] (fax)  [facility]

## 2023-05-02 ENCOUNTER — Telehealth: Payer: Self-pay

## 2023-05-02 DIAGNOSIS — R300 Dysuria: Secondary | ICD-10-CM | POA: Diagnosis not present

## 2023-05-02 DIAGNOSIS — N3010 Interstitial cystitis (chronic) without hematuria: Secondary | ICD-10-CM | POA: Diagnosis not present

## 2023-05-02 DIAGNOSIS — R3129 Other microscopic hematuria: Secondary | ICD-10-CM | POA: Diagnosis not present

## 2023-05-02 NOTE — Telephone Encounter (Signed)
 Copied from CRM [id number]. Topic: Clinical - Medical Advice >> May 02, 2023  1:05 PM [place] A wrote: Reason for CRM: Patient said that she was in on 04/29/23 and forgot to mention lab test for what is going on with her- pt states was discussed during visit on 04-29-23. Patient would like to know if she should have labs performed. Please contact

## 2023-05-02 NOTE — Telephone Encounter (Signed)
 Hi Mardella Layman, can you help?  I am not sure what labs she may need-I assume a CBC and maybe ferritin?  Let me know what you were thinking and I can bring the patient in for lab work

## 2023-05-02 NOTE — Telephone Encounter (Signed)
 Pt was seen on 04/29/23 by Lillia Abed for Menorrhagia with regular cycle, Axillary accessory breast tissue   Ref placed for agree endoscopy and allergy testing.  Advised to f/u  in about 3 months (around 07/30/2023) for CPE   Please advise.

## 2023-05-03 DIAGNOSIS — K2100 Gastro-esophageal reflux disease with esophagitis, without bleeding: Secondary | ICD-10-CM | POA: Diagnosis not present

## 2023-05-03 DIAGNOSIS — K2289 Other specified disease of esophagus: Secondary | ICD-10-CM | POA: Diagnosis not present

## 2023-05-03 DIAGNOSIS — R1013 Epigastric pain: Secondary | ICD-10-CM | POA: Diagnosis not present

## 2023-05-03 DIAGNOSIS — R1310 Dysphagia, unspecified: Secondary | ICD-10-CM | POA: Diagnosis not present

## 2023-05-03 DIAGNOSIS — K3189 Other diseases of stomach and duodenum: Secondary | ICD-10-CM | POA: Diagnosis not present

## 2023-05-03 DIAGNOSIS — K2950 Unspecified chronic gastritis without bleeding: Secondary | ICD-10-CM | POA: Diagnosis not present

## 2023-05-03 NOTE — Telephone Encounter (Signed)
 Called patient but no answer, lvm for patient to call back about which test is she referring to specifically.

## 2023-05-04 DIAGNOSIS — R5383 Other fatigue: Secondary | ICD-10-CM | POA: Diagnosis not present

## 2023-05-04 DIAGNOSIS — Z1330 Encounter for screening examination for mental health and behavioral disorders, unspecified: Secondary | ICD-10-CM | POA: Diagnosis not present

## 2023-05-04 DIAGNOSIS — M2550 Pain in unspecified joint: Secondary | ICD-10-CM | POA: Diagnosis not present

## 2023-05-09 NOTE — Telephone Encounter (Signed)
 Called patient but no answer, left voice mail for patient to call back.

## 2023-05-10 DIAGNOSIS — L810 Postinflammatory hyperpigmentation: Secondary | ICD-10-CM | POA: Diagnosis not present

## 2023-05-10 DIAGNOSIS — L562 Photocontact dermatitis [berloque dermatitis]: Secondary | ICD-10-CM | POA: Diagnosis not present

## 2023-05-10 DIAGNOSIS — F424 Excoriation (skin-picking) disorder: Secondary | ICD-10-CM | POA: Diagnosis not present

## 2023-05-11 NOTE — Progress Notes (Unsigned)
 [facility] [address] [place], [place] [address] [phone]  Date:  05/12/2023   Name:  Sandra Richmond   DOB:  18-Sep-2000   MRN:  [id number]  PCP:  Pearline Cables, MD    Chief Complaint: No chief complaint on file.   History of Present Illness:  Sandra Richmond is a 23 y.o. very pleasant female patient who presents with the following:  Pt seen today for a virtual visit with concern of GERD sx Patient location is home, my location is office.  Patient identity confirmed with 2 factors, she gives consent for virtual visit today.  The patient myself are present on the call today  We connected via video but then changed to audio only due to extremely poor audio quality on video screen  Last seen by myself about one year ago also with sx of GERD - She has history of chronic GERD, she did have an upper GI in 2020 which noted some esophagitis.   She saw my partner Alfredia Ferguson about 10 days ago with concern of menorrhagia- they restarted OCP At this time patient feels that there is mucus caught in the back of her throat and pressure in her sinuses which is uncomfortable.  She was seen by ENT and also did an endoscopy via [facility] gastroenterology.  Patient reports neither of these were particularly helpful in explaining her symptoms   She is getting an allergy testing next week She is taking zyrtec and one lansoprazole and also amitriptyline   Patient Active Problem List   Diagnosis Date Noted   Migraine without status migrainosus, not intractable 12/15/2021   Appendicitis, acute 07/06/2013    Past Medical History:  Diagnosis Date   Anxiety    GERD (gastroesophageal reflux disease)    Urinary tract infection     Past Surgical History:  Procedure Laterality Date   APPENDECTOMY     LAPAROSCOPIC APPENDECTOMY N/A 07/06/2013   Procedure: APPENDECTOMY LAPAROSCOPIC;  Surgeon: Judie Petit. Leonia Corona, MD;  Location: [facility] OR;   Service: Pediatrics;  Laterality: N/A;    Social History   Tobacco Use   Smoking status: Never   Smokeless tobacco: Never  Vaping Use   Vaping status: Never Used  Substance Use Topics   Alcohol use: No   Drug use: No    Family History  Problem Relation Age of Onset   Diabetes Paternal Grandmother    Hypertension Paternal Grandfather    Diabetes Paternal Grandfather    Colon cancer Neg Hx     Allergies  Allergen Reactions   Diflucan [Fluconazole]     Felt strange- see ER note 03/02/17    Medication list has been reviewed and updated.  Current Outpatient Medications on File Prior to Visit  Medication Sig Dispense Refill   amitriptyline (ELAVIL) 25 MG tablet Take 25 mg by mouth at bedtime.     lansoprazole (PREVACID) 30 MG capsule Take 30 mg by mouth daily.     levonorgestrel-ethinyl estradiol (VIENVA) 0.1-20 MG-MCG tablet Take 1 tablet by mouth daily. 84 tablet 2   meloxicam (MOBIC) 15 MG tablet Take 1 tablet (15 mg total) by mouth daily. 30 tablet 0   No current facility-administered medications on file prior to visit.    Review of Systems:  As per HPI- otherwise negative.   Physical Examination: There were no vitals filed for this visit. There were no vitals filed for this visit. There is  no height or weight on file to calculate BMI. Ideal Body Weight:   Patient observed via video monitor, she looks well Assessment and Plan: Nasal congestion  Patient seen virtually today with concern of what sounds like nasal congestion.  She has already seen ENT as well as had an endoscopy per gastroenterology.  She will have allergy testing next week.  She wonders what ideas I might have about her feeling of nasal and sinus congestion and sensation of mucus in her throat.  I suggested that she try a nasal steroid spray which she has not yet Signed Abbe Amsterdam, MD

## 2023-05-12 ENCOUNTER — Telehealth: Admitting: Family Medicine

## 2023-05-12 ENCOUNTER — Encounter: Payer: Self-pay | Admitting: Family Medicine

## 2023-05-12 DIAGNOSIS — R0981 Nasal congestion: Secondary | ICD-10-CM | POA: Diagnosis not present

## 2023-05-20 DIAGNOSIS — J3089 Other allergic rhinitis: Secondary | ICD-10-CM | POA: Diagnosis not present

## 2023-06-21 DIAGNOSIS — N898 Other specified noninflammatory disorders of vagina: Secondary | ICD-10-CM | POA: Diagnosis not present

## 2023-06-21 DIAGNOSIS — R102 Pelvic and perineal pain: Secondary | ICD-10-CM | POA: Diagnosis not present

## 2023-06-21 DIAGNOSIS — N94819 Vulvodynia, unspecified: Secondary | ICD-10-CM | POA: Diagnosis not present

## 2023-06-29 DIAGNOSIS — F411 Generalized anxiety disorder: Secondary | ICD-10-CM | POA: Diagnosis not present

## 2023-07-06 DIAGNOSIS — F411 Generalized anxiety disorder: Secondary | ICD-10-CM | POA: Diagnosis not present

## 2023-07-13 DIAGNOSIS — F411 Generalized anxiety disorder: Secondary | ICD-10-CM | POA: Diagnosis not present

## 2023-07-20 DIAGNOSIS — F411 Generalized anxiety disorder: Secondary | ICD-10-CM | POA: Diagnosis not present

## 2023-07-28 DIAGNOSIS — B3731 Acute candidiasis of vulva and vagina: Secondary | ICD-10-CM | POA: Diagnosis not present

## 2023-08-11 ENCOUNTER — Telehealth: Payer: Self-pay

## 2023-08-11 NOTE — Telephone Encounter (Signed)
 That is fine, please call and get her set up.  She can do a quantiferon or a PPD per her preferance

## 2023-08-11 NOTE — Telephone Encounter (Signed)
 Copied from CRM [id number]. Topic: Appointments - Scheduling Inquiry for Clinic >> Aug 11, 2023  9:44 AM Revonda D wrote: Reason for CRM: Pt is wanting to get an order put in for a TB placement. Pt would like to come in on next Tuesday to have the placement completed and would like a callback today if possible to schedule the appt.

## 2023-08-15 NOTE — Telephone Encounter (Signed)
 Copied from CRM [id number]. Topic: Appointments - Scheduling Inquiry for Clinic >> Aug 11, 2023  9:44 AM Revonda D wrote: Reason for CRM: Pt is wanting to get an order put in for a TB placement. Pt would like to come in on next Tuesday to have the placement completed and would like a callback today if possible to schedule the appt.

## 2023-08-15 NOTE — Telephone Encounter (Signed)
 Called pt and left a VM asking pt to give the office a call back to schedule PPD testing, advised pt if she would like a skin test to schedule as a NV also advised pt if she would like the lab test done to schedule lab appointment.

## 2023-08-19 DIAGNOSIS — K5902 Outlet dysfunction constipation: Secondary | ICD-10-CM | POA: Diagnosis not present

## 2023-08-19 DIAGNOSIS — R102 Pelvic and perineal pain: Secondary | ICD-10-CM | POA: Diagnosis not present

## 2023-08-24 DIAGNOSIS — F411 Generalized anxiety disorder: Secondary | ICD-10-CM | POA: Diagnosis not present

## 2023-08-26 DIAGNOSIS — L219 Seborrheic dermatitis, unspecified: Secondary | ICD-10-CM | POA: Diagnosis not present

## 2023-08-26 DIAGNOSIS — D2262 Melanocytic nevi of left upper limb, including shoulder: Secondary | ICD-10-CM | POA: Diagnosis not present

## 2023-08-26 DIAGNOSIS — D225 Melanocytic nevi of trunk: Secondary | ICD-10-CM | POA: Diagnosis not present

## 2023-08-26 DIAGNOSIS — D2261 Melanocytic nevi of right upper limb, including shoulder: Secondary | ICD-10-CM | POA: Diagnosis not present

## 2023-08-31 ENCOUNTER — Ambulatory Visit (INDEPENDENT_AMBULATORY_CARE_PROVIDER_SITE_OTHER)

## 2023-08-31 DIAGNOSIS — Z111 Encounter for screening for respiratory tuberculosis: Secondary | ICD-10-CM | POA: Diagnosis not present

## 2023-08-31 DIAGNOSIS — F411 Generalized anxiety disorder: Secondary | ICD-10-CM | POA: Diagnosis not present

## 2023-08-31 NOTE — Progress Notes (Signed)
 Patient here today for PPD placement , pt tolerated placement well.  PPD placement in right forearm, wheel formed.   Pt scheduled for read on Friday

## 2023-09-02 ENCOUNTER — Ambulatory Visit (INDEPENDENT_AMBULATORY_CARE_PROVIDER_SITE_OTHER)

## 2023-09-02 DIAGNOSIS — Z111 Encounter for screening for respiratory tuberculosis: Secondary | ICD-10-CM

## 2023-09-02 LAB — TB SKIN TEST
Induration: 0 mm
TB Skin Test: NEGATIVE

## 2023-09-02 NOTE — Progress Notes (Addendum)
 Patient came in today, 09/02/23 for TB skin test reading and results were negative O mm.

## 2023-09-09 DIAGNOSIS — R102 Pelvic and perineal pain: Secondary | ICD-10-CM | POA: Diagnosis not present

## 2023-09-09 DIAGNOSIS — N83201 Unspecified ovarian cyst, right side: Secondary | ICD-10-CM | POA: Diagnosis not present

## 2023-09-09 DIAGNOSIS — N83202 Unspecified ovarian cyst, left side: Secondary | ICD-10-CM | POA: Diagnosis not present

## 2023-09-14 DIAGNOSIS — K5902 Outlet dysfunction constipation: Secondary | ICD-10-CM | POA: Diagnosis not present

## 2023-09-14 DIAGNOSIS — R102 Pelvic and perineal pain: Secondary | ICD-10-CM | POA: Diagnosis not present

## 2023-09-16 DIAGNOSIS — N946 Dysmenorrhea, unspecified: Secondary | ICD-10-CM | POA: Diagnosis not present

## 2023-09-16 DIAGNOSIS — R102 Pelvic and perineal pain: Secondary | ICD-10-CM | POA: Diagnosis not present

## 2023-09-16 DIAGNOSIS — G8929 Other chronic pain: Secondary | ICD-10-CM | POA: Diagnosis not present

## 2023-09-24 DIAGNOSIS — F411 Generalized anxiety disorder: Secondary | ICD-10-CM | POA: Diagnosis not present

## 2023-09-28 DIAGNOSIS — F411 Generalized anxiety disorder: Secondary | ICD-10-CM | POA: Diagnosis not present

## 2023-10-07 DIAGNOSIS — B351 Tinea unguium: Secondary | ICD-10-CM | POA: Diagnosis not present

## 2023-10-08 DIAGNOSIS — F411 Generalized anxiety disorder: Secondary | ICD-10-CM | POA: Diagnosis not present

## 2023-10-18 DIAGNOSIS — K5902 Outlet dysfunction constipation: Secondary | ICD-10-CM | POA: Diagnosis not present

## 2023-10-18 DIAGNOSIS — G8929 Other chronic pain: Secondary | ICD-10-CM | POA: Diagnosis not present

## 2023-10-18 DIAGNOSIS — N946 Dysmenorrhea, unspecified: Secondary | ICD-10-CM | POA: Diagnosis not present

## 2023-10-18 DIAGNOSIS — N94819 Vulvodynia, unspecified: Secondary | ICD-10-CM | POA: Diagnosis not present

## 2023-10-22 DIAGNOSIS — F411 Generalized anxiety disorder: Secondary | ICD-10-CM | POA: Diagnosis not present

## 2023-11-03 DIAGNOSIS — N809 Endometriosis, unspecified: Secondary | ICD-10-CM | POA: Diagnosis not present

## 2023-11-12 ENCOUNTER — Encounter: Payer: Self-pay | Admitting: Family Medicine

## 2023-12-04 DIAGNOSIS — Z20822 Contact with and (suspected) exposure to covid-19: Secondary | ICD-10-CM | POA: Diagnosis not present

## 2023-12-04 DIAGNOSIS — J029 Acute pharyngitis, unspecified: Secondary | ICD-10-CM | POA: Diagnosis not present

## 2023-12-28 DIAGNOSIS — R112 Nausea with vomiting, unspecified: Secondary | ICD-10-CM | POA: Diagnosis not present

## 2023-12-28 DIAGNOSIS — R197 Diarrhea, unspecified: Secondary | ICD-10-CM | POA: Diagnosis not present

## 2023-12-28 DIAGNOSIS — R140 Abdominal distension (gaseous): Secondary | ICD-10-CM | POA: Diagnosis not present

## 2023-12-28 DIAGNOSIS — R109 Unspecified abdominal pain: Secondary | ICD-10-CM | POA: Diagnosis not present

## 2023-12-30 DIAGNOSIS — R109 Unspecified abdominal pain: Secondary | ICD-10-CM | POA: Diagnosis not present

## 2023-12-30 DIAGNOSIS — R112 Nausea with vomiting, unspecified: Secondary | ICD-10-CM | POA: Diagnosis not present

## 2023-12-30 DIAGNOSIS — R197 Diarrhea, unspecified: Secondary | ICD-10-CM | POA: Diagnosis not present

## 2023-12-30 DIAGNOSIS — R140 Abdominal distension (gaseous): Secondary | ICD-10-CM | POA: Diagnosis not present
